# Patient Record
Sex: Female | Born: 1990 | Race: Black or African American | Hispanic: No | Smoking: Never smoker
Health system: Southern US, Community
[De-identification: ages and names within clinical notes are randomized; demographics above are authoritative.]

## PROBLEM LIST (undated history)

## (undated) DIAGNOSIS — N83209 Unspecified ovarian cyst, unspecified side: Secondary | ICD-10-CM

## (undated) HISTORY — PX: NO PAST SURGERIES: SHX2092

## (undated) HISTORY — DX: Unspecified ovarian cyst, unspecified side: N83.209

---

## 2018-01-15 ENCOUNTER — Encounter (HOSPITAL_COMMUNITY): Payer: Self-pay | Admitting: Emergency Medicine

## 2018-01-15 DIAGNOSIS — R55 Syncope and collapse: Secondary | ICD-10-CM | POA: Insufficient documentation

## 2018-01-15 LAB — CBC
HEMATOCRIT: 38.1 % (ref 36.0–46.0)
Hemoglobin: 12.5 g/dL (ref 12.0–15.0)
MCH: 28.7 pg (ref 26.0–34.0)
MCHC: 32.8 g/dL (ref 30.0–36.0)
MCV: 87.6 fL (ref 78.0–100.0)
Platelets: 255 10*3/uL (ref 150–400)
RBC: 4.35 MIL/uL (ref 3.87–5.11)
RDW: 13.4 % (ref 11.5–15.5)
WBC: 10.4 10*3/uL (ref 4.0–10.5)

## 2018-01-15 LAB — BASIC METABOLIC PANEL
Anion gap: 8 (ref 5–15)
BUN: 13 mg/dL (ref 6–20)
CALCIUM: 9.4 mg/dL (ref 8.9–10.3)
CO2: 25 mmol/L (ref 22–32)
Chloride: 105 mmol/L (ref 101–111)
Creatinine, Ser: 0.82 mg/dL (ref 0.44–1.00)
GFR calc Af Amer: >60 mL/min (ref >60)
GLUCOSE: 130 mg/dL — AB (ref 65–99)
POTASSIUM: 3.6 mmol/L (ref 3.5–5.1)
SODIUM: 138 mmol/L (ref 135–145)

## 2018-01-15 LAB — I-STAT BETA HCG BLOOD, ED (MC, WL, AP ONLY): I-stat hCG, quantitative: <5 m[IU]/mL (ref <5)

## 2018-01-15 NOTE — ED Triage Notes (Signed)
Patient c/o near syncopal episode while in class today. Reports fasting x2 days, only having water to drink during that time. Denies chest pain and SOB.

## 2018-01-16 ENCOUNTER — Emergency Department (HOSPITAL_COMMUNITY)
Admission: EM | Admit: 2018-01-16 | Discharge: 2018-01-16 | Disposition: A | Discharge status: Home / Self Care | Payer: Self-pay | Attending: Emergency Medicine | Admitting: Emergency Medicine

## 2018-01-16 DIAGNOSIS — R55 Syncope and collapse: Secondary | ICD-10-CM

## 2018-01-16 LAB — URINALYSIS, ROUTINE W REFLEX MICROSCOPIC
Bilirubin Urine: NEGATIVE
Glucose, UA: NEGATIVE mg/dL
HGB URINE DIPSTICK: NEGATIVE
Ketones, ur: 80 mg/dL — AB
LEUKOCYTES UA: NEGATIVE
Nitrite: NEGATIVE
Protein, ur: 30 mg/dL — AB
SPECIFIC GRAVITY, URINE: 1.027 (ref 1.005–1.030)
pH: 5 (ref 5.0–8.0)

## 2018-01-16 MED ORDER — ONDANSETRON 8 MG PO TBDP
8.0000 mg | ORAL_TABLET | Freq: Once | ORAL | Status: AC
  Administered 2018-01-16: 8 mg via ORAL
  Filled 2018-01-16: qty 1

## 2018-01-16 NOTE — ED Provider Notes (Signed)
[facility]-EMERGENCY DEPT Provider Note   CSN: [id number] Arrival date & time: 01/15/18  1943     History   Chief Complaint Chief Complaint  Patient presents with  . Near Syncope    HPI Brittany Key is a 27 y.o. female.  The history is provided by the patient.  Near Syncope  This is a new problem. The current episode started 3 to 5 hours ago. The problem occurs constantly. The problem has been resolved. Pertinent negatives include no chest pain, no abdominal pain, no headaches and no shortness of breath. Nothing aggravates the symptoms. Nothing relieves the symptoms. She has tried nothing for the symptoms. The treatment provided significant relief.  felt lightheaded as she has been doing a water fast for the past few days.  She is drinking only water and liquids but is not eating solids.    History reviewed. No pertinent past medical history.  There are no active problems to display for this patient.   History reviewed. No pertinent surgical history.  OB History    No data available       Home Medications    Prior to Admission medications   Not on File    Family History No family history on file.  Social History Social History   Tobacco Use  . Smoking status: Not on file  Substance Use Topics  . Alcohol use: Not on file  . Drug use: Not on file     Allergies   Patient has no known allergies.   Review of Systems Review of Systems  Constitutional: Negative for diaphoresis, fatigue and fever.  Eyes: Negative for photophobia and visual disturbance.  Respiratory: Negative for shortness of breath.   Cardiovascular: Positive for near-syncope. Negative for chest pain.  Gastrointestinal: Negative for abdominal pain.  Neurological: Negative for dizziness, tremors, seizures, syncope, facial asymmetry, speech difficulty, weakness, numbness and headaches.  All other systems reviewed and are negative.    Physical Exam Updated Vital  Signs BP 109/85 (BP Location: Left Arm)   Pulse (!) 52   Temp 98.4 F (36.9 C) (Oral)   Resp 14   LMP 12/31/2017   SpO2 100%   Physical Exam  Constitutional: She is oriented to person, place, and time. She appears well-developed and well-nourished. No distress.  HENT:  Head: Normocephalic and atraumatic.  Nose: Nose normal.  Mouth/Throat: No oropharyngeal exudate.  Eyes: Conjunctivae are normal. Pupils are equal, round, and reactive to light.  Neck: Normal range of motion. Neck supple.  Cardiovascular: Normal rate, regular rhythm, normal heart sounds and intact distal pulses.  Pulmonary/Chest: Effort normal and breath sounds normal. No stridor. She has no wheezes. She has no rales.  Abdominal: Soft. Bowel sounds are normal. She exhibits no mass. There is no tenderness. There is no rebound and no guarding.  Musculoskeletal: Normal range of motion.  Neurological: She is alert and oriented to person, place, and time. She displays normal reflexes.  Skin: Skin is warm and dry. Capillary refill takes less than 2 seconds.  Psychiatric: She has a normal mood and affect.     ED Treatments / Results  Labs (all labs ordered are listed, but only abnormal results are displayed)  Results for orders placed or performed during the hospital encounter of 01/16/18  Basic metabolic panel  Result Value Ref Range   Sodium 138 135 - 145 mmol/L   Potassium 3.6 3.5 - 5.1 mmol/L   Chloride 105 101 - 111 mmol/L   CO2 25  22 - 32 mmol/L   Glucose, Bld 130 (H) 65 - 99 mg/dL   BUN 13 6 - 20 mg/dL   Creatinine, Ser 4.090.82 0.44 - 1.00 mg/dL   Calcium 9.4 8.9 - 81.110.3 mg/dL   GFR calc non Af Amer >60 >60 mL/min   GFR calc Af Amer >60 >60 mL/min   Anion gap 8 5 - 15  CBC  Result Value Ref Range   WBC 10.4 4.0 - 10.5 K/uL   RBC 4.35 3.87 - 5.11 MIL/uL   Hemoglobin 12.5 12.0 - 15.0 g/dL   HCT 91.438.1 78.236.0 - 95.646.0 %   MCV 87.6 78.0 - 100.0 fL   MCH 28.7 26.0 - 34.0 pg   MCHC 32.8 30.0 - 36.0 g/dL   RDW 21.313.4  08.611.5 - 57.815.5 %   Platelets 255 150 - 400 K/uL  I-Stat beta hCG blood, ED  Result Value Ref Range   I-stat hCG, quantitative <5.0 <5 mIU/mL   Comment 3           No results found.  Orthostatic VS for the past 24 hrs:  BP- Lying Pulse- Lying BP- Sitting Pulse- Sitting BP- Standing at 0 minutes Pulse- Standing at 0 minutes  01/16/18 0246 109/85 52 120/80 54 110/84 72     Procedures Procedures (including critical care time)  Medications Ordered in ED Medications  ondansetron (ZOFRAN-ODT) disintegrating tablet 8 mg (8 mg Oral Given 01/16/18 0254)       No CP no SOB, Po challenged successfully in the ED.  No signs of dehydration.    Final Clinical Impressions(s) / ED Diagnoses   No signs of dehydration.  PERC negative wells 0 highly doubt PE in this low risk patient.  Her symptoms are clearly related to fasting and she has been instructed on eating a healthy diet and not fasting.     Return for weakness, numbness, changes in vision or speech, fevers >100.4 unrelieved by medication, shortness of breath, intractable vomiting, or diarrhea, abdominal pain, Inability to tolerate liquids or food, cough, altered mental status or any concerns. No signs of systemic illness or infection. The patient is nontoxic-appearing on exam and vital signs are within normal limits.   I have reviewed the triage vital signs and the nursing notes. Pertinent labs &imaging results that were available during my care of the patient were reviewed by me and considered in my medical decision making (see chart for details).  After history, exam, and medical workup I feel the patient has been appropriately medically screened and is safe for discharge home. Pertinent diagnoses were discussed with the patient. Patient was given return precautions.   Broady Lafoy, MD 01/16/18 [phone]

## 2019-01-01 ENCOUNTER — Encounter: Payer: Self-pay | Admitting: General Practice

## 2019-01-09 ENCOUNTER — Emergency Department (HOSPITAL_COMMUNITY)
Admission: EM | Admit: 2019-01-09 | Discharge: 2019-01-09 | Disposition: A | Discharge status: Home / Self Care | Payer: BLUE CROSS/BLUE SHIELD | Attending: Emergency Medicine | Admitting: Emergency Medicine

## 2019-01-09 ENCOUNTER — Other Ambulatory Visit: Payer: Self-pay

## 2019-01-09 ENCOUNTER — Encounter (HOSPITAL_COMMUNITY): Payer: Self-pay | Admitting: Emergency Medicine

## 2019-01-09 DIAGNOSIS — Z3491 Encounter for supervision of normal pregnancy, unspecified, first trimester: Secondary | ICD-10-CM

## 2019-01-09 DIAGNOSIS — R109 Unspecified abdominal pain: Secondary | ICD-10-CM | POA: Diagnosis present

## 2019-01-09 LAB — I-STAT BETA HCG BLOOD, ED (MC, WL, AP ONLY)

## 2019-01-09 LAB — COMPREHENSIVE METABOLIC PANEL
ALBUMIN: 4.2 g/dL (ref 3.5–5.0)
ALT: 12 U/L (ref 0–44)
ANION GAP: 8 (ref 5–15)
AST: 18 U/L (ref 15–41)
Alkaline Phosphatase: 59 U/L (ref 38–126)
BUN: 8 mg/dL (ref 6–20)
CHLORIDE: 105 mmol/L (ref 98–111)
CO2: 23 mmol/L (ref 22–32)
Calcium: 9.4 mg/dL (ref 8.9–10.3)
Creatinine, Ser: 0.6 mg/dL (ref 0.44–1.00)
GFR calc Af Amer: >60 mL/min (ref >60)
GLUCOSE: 82 mg/dL (ref 70–99)
POTASSIUM: 3.8 mmol/L (ref 3.5–5.1)
Sodium: 136 mmol/L (ref 135–145)
TOTAL PROTEIN: 7.5 g/dL (ref 6.5–8.1)
Total Bilirubin: 0.7 mg/dL (ref 0.3–1.2)

## 2019-01-09 LAB — CBC
HCT: 37.5 % (ref 36.0–46.0)
HEMOGLOBIN: 11.7 g/dL — AB (ref 12.0–15.0)
MCH: 27.7 pg (ref 26.0–34.0)
MCHC: 31.2 g/dL (ref 30.0–36.0)
MCV: 88.9 fL (ref 80.0–100.0)
Platelets: 306 10*3/uL (ref 150–400)
RBC: 4.22 MIL/uL (ref 3.87–5.11)
RDW: 13.8 % (ref 11.5–15.5)
WBC: 7.2 10*3/uL (ref 4.0–10.5)
nRBC: 0 % (ref 0.0–0.2)

## 2019-01-09 LAB — LIPASE, BLOOD: LIPASE: 22 U/L (ref 11–51)

## 2019-01-09 MED ORDER — PRENATAL COMPLETE 14-0.4 MG PO TABS: 1.0000 | ORAL_TABLET | Freq: Every day | ORAL | 0 refills | Status: AC

## 2019-01-09 MED ORDER — SODIUM CHLORIDE 0.9% FLUSH: 3.0000 mL | Freq: Once | INTRAVENOUS | Status: DC

## 2019-01-09 NOTE — Discharge Instructions (Addendum)
Take prenatal vitamins daily.   Take tylenol as needed for pain. Avoid ibuprofen   See your OB doctor  Return to ER if you have vaginal bleeding, discharge, fever, vomiting.

## 2019-01-09 NOTE — ED Provider Notes (Signed)
[facility]-EMERGENCY DEPT Provider Note   CSN: [id number] Arrival date & time: 01/09/19  1113    History   Chief Complaint Chief Complaint  Patient presents with  . Abdominal Pain    HPI Brittany Key is a 28 y.o. female here presenting with possible pregnancy.  Patient states that her last menstrual period was January 16.  She states that she had some lower abdominal cramps for the last week or so.  Denies any vaginal bleeding or discharge.  She felt that she may be [redacted] weeks pregnant but just wanted to be sure.  She denies any fevers or chills but felt nauseated.  She has not taken any prenatal vitamins and has appointment with [facility] doctor on March 30.     The history is provided by the patient.    History reviewed. No pertinent past medical history.  There are no active problems to display for this patient.   History reviewed. No pertinent surgical history.   OB History    Gravida  1   Para      Term      Preterm      AB      Living        SAB      TAB      Ectopic      Multiple      Live Births               Home Medications    Prior to Admission medications   Not on File    Family History No family history on file.  Social History Social History   Tobacco Use  . Smoking status: Not on file  Substance Use Topics  . Alcohol use: Not on file  . Drug use: Not on file     Allergies   Pineapple and Latex   Review of Systems Review of Systems  Gastrointestinal: Positive for abdominal pain.  All other systems reviewed and are negative.    Physical Exam Updated Vital Signs BP 110/83   Pulse 69   Temp 99 F (37.2 C)   Resp 18   Ht 5\' 4"  (1.626 m)   Wt 77.6 kg   LMP 11/21/2018   SpO2 100%   BMI 29.35 kg/m   Physical Exam Vitals signs and nursing note reviewed.  HENT:     Head: Normocephalic.     Mouth/Throat:     Mouth: Mucous membranes are moist.  Eyes:     Extraocular Movements: Extraocular  movements intact.  Cardiovascular:     Rate and Rhythm: Normal rate and regular rhythm.     Heart sounds: Normal heart sounds.  Pulmonary:     Effort: Pulmonary effort is normal.     Breath sounds: Normal breath sounds.  Abdominal:     General: Abdomen is flat. Bowel sounds are normal.     Palpations: Abdomen is soft.     Comments: ? Gravid uterus, nontender   Skin:    General: Skin is warm.     Capillary Refill: Capillary refill takes less than 2 seconds.  Neurological:     General: No focal deficit present.     Mental Status: She is alert.  Psychiatric:        Mood and Affect: Mood normal.        Behavior: Behavior normal.      ED Treatments / Results  Labs (all labs ordered are listed, but only abnormal results are displayed)  Labs Reviewed  CBC - Abnormal; Notable for the following components:      Result Value   Hemoglobin 11.7 (*)    All other components within normal limits  I-STAT BETA HCG BLOOD, ED ([facility], [facility], [facility] ONLY) - Abnormal; Notable for the following components:   I-stat hCG, quantitative >2,000.0 (*)    All other components within normal limits  LIPASE, BLOOD  COMPREHENSIVE METABOLIC PANEL  URINALYSIS, ROUTINE W REFLEX MICROSCOPIC    EKG None  Radiology No results found.  Procedures Procedures (including critical care time)  EMERGENCY DEPARTMENT [place] PREGNANCY "Study: Limited Ultrasound of the Pelvis for Pregnancy"  INDICATIONS:Pregnancy(required) Multiple views of the uterus and pelvic cavity were obtained in real-time with a multi-frequency probe.  APPROACH:Transabdominal  PERFORMED BY: Myself IMAGES ARCHIVED?: Yes LIMITATIONS: Body habitus PREGNANCY FREE FLUID: None ADNEXAL FINDINGS:Left ovary not seen and Right ovary not seen GESTATIONAL AGE, ESTIMATE: 6 week 4 days FETAL HEART RATE: 170 INTERPRETATION: Intrauterine gestational sac noted, Yolk sac noted, Fetal pole present and Fetal heart activity seen      Medications Ordered in  ED Medications  sodium chloride flush (NS) 0.9 % injection 3 mL (has no administration in time range)     Initial Impression / Assessment and Plan / ED Course  I have reviewed the triage vital signs and the nursing notes.  Pertinent labs & imaging results that were available during my care of the patient were reviewed by me and considered in my medical decision making (see chart for details).        Brittany Key is a 28 y.o. female here presenting with lower abdominal cramps.  She is about [redacted] weeks pregnant by dates.  She has no vaginal bleeding or vomiting or pain. HCG > 2000. Labs unremarkable. Bedside US confirmed live IUP with FHR of 170. No free fluid or adnexal mass. She has OB follow up. Told her to take prenatal vitamins and gave strict return precautions.    Final Clinical Impressions(s) / ED Diagnoses   Final diagnoses:  None    ED Discharge Orders    None       Charlynne Pander, MD 01/09/19 1302

## 2019-01-09 NOTE — ED Triage Notes (Signed)
Patient reports intermittent lower abdominal cramping x "a couple weeks." Reports she is seven weeks pregnant. States first OB appt is scheduled for 3/30. Denies vaginal bleeding.

## 2019-01-09 NOTE — ED Notes (Signed)
ED Provider at bedside. 

## 2019-01-27 ENCOUNTER — Ambulatory Visit (INDEPENDENT_AMBULATORY_CARE_PROVIDER_SITE_OTHER): Payer: BLUE CROSS/BLUE SHIELD | Admitting: *Deleted

## 2019-01-27 ENCOUNTER — Other Ambulatory Visit: Payer: Self-pay

## 2019-01-27 DIAGNOSIS — Z3400 Encounter for supervision of normal first pregnancy, unspecified trimester: Secondary | ICD-10-CM

## 2019-01-27 DIAGNOSIS — Z3401 Encounter for supervision of normal first pregnancy, first trimester: Secondary | ICD-10-CM

## 2019-01-27 NOTE — Patient Instructions (Addendum)
First Trimester of Pregnancy  The first trimester of pregnancy is from week 1 until the end of week 13 (months 1 through 3). During this time, your baby will begin to develop inside you. At 6-8 weeks, the eyes and face are formed, and the heartbeat can be seen on ultrasound. At the end of 12 weeks, all the baby's organs are formed. Prenatal care is all the medical care you receive before the birth of your baby. Make sure you get good prenatal care and follow all of your doctor's instructions. Follow these instructions at home: Medicines  Take over-the-counter and prescription medicines only as told by your doctor. Some medicines are safe and some medicines are not safe during pregnancy.  Take a prenatal vitamin that contains at least 600 micrograms (mcg) of folic acid.  If you have trouble pooping (constipation), take medicine that will make your stool soft (stool softener) if your doctor approves. Eating and drinking   Eat regular, healthy meals.  Your doctor will tell you the amount of weight gain that is right for you.  Avoid raw meat and uncooked cheese.  If you feel sick to your stomach (nauseous) or throw up (vomit): ? Eat 4 or 5 small meals a day instead of 3 large meals. ? Try eating a few soda crackers. ? Drink liquids between meals instead of during meals.  To prevent constipation: ? Eat foods that are high in fiber, like fresh fruits and vegetables, whole grains, and beans. ? Drink enough fluids to keep your pee (urine) clear or pale yellow. Activity  Exercise only as told by your doctor. Stop exercising if you have cramps or pain in your lower belly (abdomen) or low back.  Do not exercise if it is too hot, too humid, or if you are in a place of great height (high altitude).  Try to avoid standing for long periods of time. Move your legs often if you must stand in one place for a long time.  Avoid heavy lifting.  Wear low-heeled shoes. Sit and stand up  straight.  You can have sex unless your doctor tells you not to. Relieving pain and discomfort  Wear a good support bra if your breasts are sore.  Take warm water baths (sitz baths) to soothe pain or discomfort caused by hemorrhoids. Use hemorrhoid cream if your doctor says it is okay.  Rest with your legs raised if you have leg cramps or low back pain.  If you have puffy, bulging veins (varicose veins) in your legs: ? Wear support hose or compression stockings as told by your doctor. ? Raise (elevate) your feet for 15 minutes, 3-4 times a day. ? Limit salt in your food. Prenatal care  Schedule your prenatal visits by the twelfth week of pregnancy.  Write down your questions. Take them to your prenatal visits.  Keep all your prenatal visits as told by your doctor. This is important. Safety  Wear your seat belt at all times when driving.  Make a list of emergency phone numbers. The list should include numbers for family, friends, the hospital, and police and fire departments. General instructions  Ask your doctor for a referral to a local prenatal class. Begin classes no later than at the start of month 6 of your pregnancy.  Ask for help if you need counseling or if you need help with nutrition. Your doctor can give you advice or tell you where to go for help.  Do not use hot  tubs, steam rooms, or saunas.  Do not douche or use tampons or scented sanitary pads.  Do not cross your legs for long periods of time.  Avoid all herbs and alcohol. Avoid drugs that are not approved by your doctor.  Do not use any tobacco products, including cigarettes, chewing tobacco, and electronic cigarettes. If you need help quitting, ask your doctor. You may get counseling or other support to help you quit.  Avoid cat litter boxes and soil used by cats. These carry germs that can cause birth defects in the baby and can cause a loss of your baby (miscarriage) or stillbirth.  Visit your dentist.  At home, brush your teeth with a soft toothbrush. Be gentle when you floss. Contact a doctor if:  You are dizzy.  You have mild cramps or pressure in your lower belly.  You have a nagging pain in your belly area.  You continue to feel sick to your stomach, you throw up, or you have watery poop (diarrhea).  You have a bad smelling fluid coming from your vagina.  You have pain when you pee (urinate).  You have increased puffiness (swelling) in your face, hands, legs, or ankles. Get help right away if:  You have a fever.  You are leaking fluid from your vagina.  You have spotting or bleeding from your vagina.  You have very bad belly cramping or pain.  You gain or lose weight rapidly.  You throw up blood. It may look like coffee grounds.  You are around people who have [place] measles, fifth disease, or chickenpox.  You have a very bad headache.  You have shortness of breath.  You have any kind of trauma, such as from a fall or a car accident. Summary  The first trimester of pregnancy is from week 1 until the end of week 13 (months 1 through 3).  To take care of yourself and your unborn baby, you will need to eat healthy meals, take medicines only if your doctor tells you to do so, and do activities that are safe for you and your baby.  Keep all follow-up visits as told by your doctor. This is important as your doctor will have to ensure that your baby is healthy and growing well. This information is not intended to replace advice given to you by your health care provider. Make sure you discuss any questions you have with your health care provider. Document Released: 04/10/2008 Document Revised: 10/31/2016 Document Reviewed: 10/31/2016 Elsevier Interactive Patient Education  2019 [employer].  Breastfeeding  Choosing to breastfeed is one of the best decisions you can make for yourself and your baby. A change in hormones during pregnancy causes your breasts to make  breast milk in your milk-producing glands. Hormones prevent breast milk from being released before your baby is born. They also prompt milk flow after birth. Once breastfeeding has begun, thoughts of your baby, as well as his or her sucking or crying, can stimulate the release of milk from your milk-producing glands. Benefits of breastfeeding Research shows that breastfeeding offers many health benefits for infants and mothers. It also offers a cost-free and convenient way to feed your baby. For your baby  Your first milk (colostrum) helps your baby's digestive system to function better.  Special cells in your milk (antibodies) help your baby to fight off infections.  Breastfed babies are less likely to develop asthma, allergies, obesity, or type 2 diabetes. They are also at lower risk for sudden infant  death syndrome (SIDS).  Nutrients in breast milk are better able to meet your babys needs compared to infant formula.  Breast milk improves your baby's brain development. For you  Breastfeeding helps to create a very special bond between you and your baby.  Breastfeeding is convenient. Breast milk costs nothing and is always available at the correct temperature.  Breastfeeding helps to burn calories. It helps you to lose the weight that you gained during pregnancy.  Breastfeeding makes your uterus return faster to its size before pregnancy. It also slows bleeding (lochia) after you give birth.  Breastfeeding helps to lower your risk of developing type 2 diabetes, osteoporosis, rheumatoid arthritis, cardiovascular disease, and breast, ovarian, uterine, and endometrial cancer later in life. Breastfeeding basics Starting breastfeeding  Find a comfortable place to sit or lie down, with your neck and back well-supported.  Place a pillow or a rolled-up blanket under your baby to bring him or her to the level of your breast (if you are seated). Nursing pillows are specially designed to help  support your arms and your baby while you breastfeed.  Make sure that your baby's tummy (abdomen) is facing your abdomen.  Gently massage your breast. With your fingertips, massage from the outer edges of your breast inward toward the nipple. This encourages milk flow. If your milk flows slowly, you may need to continue this action during the feeding.  Support your breast with 4 fingers underneath and your thumb above your nipple (make the letter "C" with your hand). Make sure your fingers are well away from your nipple and your babys mouth.  Stroke your baby's lips gently with your finger or nipple.  When your baby's mouth is open wide enough, quickly bring your baby to your breast, placing your entire nipple and as much of the areola as possible into your baby's mouth. The areola is the colored area around your nipple. ? More areola should be visible above your baby's upper lip than below the lower lip. ? Your baby's lips should be opened and extended outward (flanged) to ensure an adequate, comfortable latch. ? Your baby's tongue should be between his or her lower gum and your breast.  Make sure that your baby's mouth is correctly positioned around your nipple (latched). Your baby's lips should create a seal on your breast and be turned out (everted).  It is common for your baby to suck about 2-3 minutes in order to start the flow of breast milk. Latching Teaching your baby how to latch onto your breast properly is very important. An improper latch can cause nipple pain, decreased milk supply, and poor weight gain in your baby. Also, if your baby is not latched onto your nipple properly, he or she may swallow some air during feeding. This can make your baby fussy. Burping your baby when you switch breasts during the feeding can help to get rid of the air. However, teaching your baby to latch on properly is still the best way to prevent fussiness from swallowing air while breastfeeding. Signs  that your baby has successfully latched onto your nipple  Silent tugging or silent sucking, without causing you pain. Infant's lips should be extended outward (flanged).  Swallowing heard between every 3-4 sucks once your milk has started to flow (after your let-down milk reflex occurs).  Muscle movement above and in front of his or her ears while sucking. Signs that your baby has not successfully latched onto your nipple  Sucking sounds or smacking sounds  from your baby while breastfeeding.  Nipple pain. If you think your baby has not latched on correctly, slip your finger into the corner of your babys mouth to break the suction and place it between your baby's gums. Attempt to start breastfeeding again. Signs of successful breastfeeding Signs from your baby  Your baby will gradually decrease the number of sucks or will completely stop sucking.  Your baby will fall asleep.  Your baby's body will relax.  Your baby will retain a small amount of milk in his or her mouth.  Your baby will let go of your breast by himself or herself. Signs from you  Breasts that have increased in firmness, weight, and size 1-3 hours after feeding.  Breasts that are softer immediately after breastfeeding.  Increased milk volume, as well as a change in milk consistency and color by the fifth day of breastfeeding.  Nipples that are not sore, cracked, or bleeding. Signs that your baby is getting enough milk  Wetting at least 1-2 diapers during the first 24 hours after birth.  Wetting at least 5-6 diapers every 24 hours for the first week after birth. The urine should be clear or pale yellow by the age of 5 days.  Wetting 6-8 diapers every 24 hours as your baby continues to grow and develop.  At least 3 stools in a 24-hour period by the age of 5 days. The stool should be soft and yellow.  At least 3 stools in a 24-hour period by the age of 7 days. The stool should be seedy and yellow.  No loss of  weight greater than 10% of birth weight during the first 3 days of life.  Average weight gain of 4-7 oz (113-198 g) per week after the age of 4 days.  Consistent daily weight gain by the age of 5 days, without weight loss after the age of 2 weeks. After a feeding, your baby may spit up a small amount of milk. This is normal. Breastfeeding frequency and duration Frequent feeding will help you make more milk and can prevent sore nipples and extremely full breasts (breast engorgement). Breastfeed when you feel the need to reduce the fullness of your breasts or when your baby shows signs of hunger. This is called "breastfeeding on demand." Signs that your baby is hungry include:  Increased alertness, activity, or restlessness.  Movement of the head from side to side.  Opening of the mouth when the corner of the mouth or cheek is stroked (rooting).  Increased sucking sounds, smacking lips, cooing, sighing, or squeaking.  Hand-to-mouth movements and sucking on fingers or hands.  Fussing or crying. Avoid introducing a pacifier to your baby in the first 4-6 weeks after your baby is born. After this time, you may choose to use a pacifier. Research has shown that pacifier use during the first year of a baby's life decreases the risk of sudden infant death syndrome (SIDS). Allow your baby to feed on each breast as long as he or she wants. When your baby unlatches or falls asleep while feeding from the first breast, offer the second breast. Because newborns are often sleepy in the first few weeks of life, you may need to awaken your baby to get him or her to feed. Breastfeeding times will vary from baby to baby. However, the following rules can serve as a guide to help you make sure that your baby is properly fed:  Newborns (babies 67 weeks of age or younger) may breastfeed  every 1-3 hours.  Newborns should not go without breastfeeding for longer than 3 hours during the day or 5 hours during the  night.  You should breastfeed your baby a minimum of 8 times in a 24-hour period. Breast milk pumping     Pumping and storing breast milk allows you to make sure that your baby is exclusively fed your breast milk, even at times when you are unable to breastfeed. This is especially important if you go back to work while you are still breastfeeding, or if you are not able to be present during feedings. Your lactation consultant can help you find a method of pumping that works best for you and give you guidelines about how long it is safe to store breast milk. Caring for your breasts while you breastfeed Nipples can become dry, cracked, and sore while breastfeeding. The following recommendations can help keep your breasts moisturized and healthy:  Avoid using soap on your nipples.  Wear a supportive bra designed especially for nursing. Avoid wearing underwire-style bras or extremely tight bras (sports bras).  Air-dry your nipples for 3-4 minutes after each feeding.  Use only cotton bra pads to absorb leaked breast milk. Leaking of breast milk between feedings is normal.  Use lanolin on your nipples after breastfeeding. Lanolin helps to maintain your skin's normal moisture barrier. Pure lanolin is not harmful (not toxic) to your baby. You may also hand express a few drops of breast milk and gently massage that milk into your nipples and allow the milk to air-dry. In the first few weeks after giving birth, some women experience breast engorgement. Engorgement can make your breasts feel heavy, warm, and tender to the touch. Engorgement peaks within 3-5 days after you give birth. The following recommendations can help to ease engorgement:  Completely empty your breasts while breastfeeding or pumping. You may want to start by applying warm, moist heat (in the shower or with warm, water-soaked hand towels) just before feeding or pumping. This increases circulation and helps the milk flow. If your baby  does not completely empty your breasts while breastfeeding, pump any extra milk after he or she is finished.  Apply ice packs to your breasts immediately after breastfeeding or pumping, unless this is too uncomfortable for you. To do this: ? Put ice in a plastic bag. ? Place a towel between your skin and the bag. ? Leave the ice on for 20 minutes, 2-3 times a day.  Make sure that your baby is latched on and positioned properly while breastfeeding. If engorgement persists after 48 hours of following these recommendations, contact your health care provider or a Science writer. Overall health care recommendations while breastfeeding  Eat 3 healthy meals and 3 snacks every day. Well-nourished mothers who are breastfeeding need an additional 450-500 calories a day. You can meet this requirement by increasing the amount of a balanced diet that you eat.  Drink enough water to keep your urine pale yellow or clear.  Rest often, relax, and continue to take your prenatal vitamins to prevent fatigue, stress, and low vitamin and mineral levels in your body (nutrient deficiencies).  Do not use any products that contain nicotine or tobacco, such as cigarettes and e-cigarettes. Your baby may be harmed by chemicals from cigarettes that pass into breast milk and exposure to secondhand smoke. If you need help quitting, ask your health care provider.  Avoid alcohol.  Do not use illegal drugs or marijuana.  Talk with your health care provider  before taking any medicines. These include over-the-counter and prescription medicines as well as vitamins and herbal supplements. Some medicines that may be harmful to your baby can pass through breast milk.  It is possible to become pregnant while breastfeeding. If birth control is desired, ask your health care provider about options that will be safe while breastfeeding your baby. Where to find more information: [employer]:  www.llli.org Contact a health care provider if:  You feel like you want to stop breastfeeding or have become frustrated with breastfeeding.  Your nipples are cracked or bleeding.  Your breasts are red, tender, or warm.  You have: ? Painful breasts or nipples. ? A swollen area on either breast. ? A fever or chills. ? Nausea or vomiting. ? Drainage other than breast milk from your nipples.  Your breasts do not become full before feedings by the fifth day after you give birth.  You feel sad and depressed.  Your baby is: ? Too sleepy to eat well. ? Having trouble sleeping. ? More than 22 week old and wetting fewer than 6 diapers in a 24-hour period. ? Not gaining weight by 47 days of age.  Your baby has fewer than 3 stools in a 24-hour period.  Your baby's skin or the white parts of his or her eyes become yellow. Get help right away if:  Your baby is overly tired (lethargic) and does not want to wake up and feed.  Your baby develops an unexplained fever. Summary  Breastfeeding offers many health benefits for infant and mothers.  Try to breastfeed your infant when he or she shows early signs of hunger.  Gently tickle or stroke your baby's lips with your finger or nipple to allow the baby to open his or her mouth. Bring the baby to your breast. Make sure that much of the areola is in your baby's mouth. Offer one side and burp the baby before you offer the other side.  Talk with your health care provider or lactation consultant if you have questions or you face problems as you breastfeed. This information is not intended to replace advice given to you by your health care provider. Make sure you discuss any questions you have with your health care provider. Document Released: 10/23/2005 Document Revised: 11/24/2016 Document Reviewed: 11/24/2016 Elsevier Interactive Patient Education  2019 [place].  Exclusive Breastfeeding  Exclusive breastfeeding means feeding a baby with  breast milk only. It is recommended that babies be exclusively breastfed for 6 months. Breastfeeding may continue until a baby is 1 year or older, if wanted by both mother and child. Exclusive breastfeeding for at least 6 months has many benefits for both the mother and the baby. What are the benefits of exclusive breastfeeding?  Exclusive breastfeeding helps your baby grow and develop normally. One reason for this is that breast milk has all the nutrients that a baby needs, when the baby needs them.  Breast milk helps develop your baby's immune system by providing proteins called antibodies that help fight off germs.  Exclusive breastfeeding may lower your baby's risk for: ? Stomach and intestinal problems. ? Allergies. ? Ear infections. ? Respiratory infections. ? Obesity. ? Diabetes. ? Sudden infant death syndrome (SIDS).  Breastfeeding helps improve your recovery from giving birth by: ? Reducing how much blood you lose after delivery. ? Speeding up how quickly your uterus heals. ? Reducing your risk of postpartum depression. ? Increasing the time before your routine menstrual periods return (lactational amenorrhea),  which can help to delay pregnancy if you are not using birth control. What are some tips for exclusive breastfeeding?  Start breastfeeding within your baby's first hour of life.  Do not give your baby infant formula, water, or solid food before your baby is 81 months old, unless told by your health care provider.  Feed your baby on-demand. This means feeding anytime your child expresses signs of hunger. This can help maintain your milk supply. Signs of hunger include: ? Moving restlessly. ? Rooting. This is when the baby looks like he or she is sucking without anything in the mouth. ? Bringing hands to the mouth. ? Crying.  Avoid using bottles in the first several weeks.  Do not use pacifiers.  If you must bottle feed: ? Continue to offer your baby breast milk by  using a breast pump to maintain your milk supply. ? Pump after feedings and store extra breast milk. ? Offer only breast milk in a bottle. What happens if I start supplementing feedings? If you work outside the home, it may be difficult to continue exclusive breastfeeding. However, you can make sure your baby continues to receive only breast milk by pumping and providing breast milk through bottle feeding. Sometimes it is necessary to supplement feedings. If your baby was born prematurely or has vitamin or mineral deficiencies, your health care provider may recommend giving your baby rehydration liquids or vitamin and mineral supplements with breast milk. If you start supplementing feedings, your baby will drink less breast milk and your body will respond by making less breast milk. If you choose to supplement feedings but would like to maintain your milk supply so you can breastfeed your baby exclusively later on, you can pump your breast milk and give your baby your breast milk by bottle. Where to find support  Health care providers and lactation specialists. They can help by: ? Giving you educational materials. ? Giving you information about where you can get supplies such as breast pumps and nursing bras. ? Providing you with counseling if you need emotional support. ? Sharing feeding basics with you, such as effective positions for breastfeeding. ? Troubleshooting feeding challenges.  Your peers. Your friends, family, and other women can help by: ? Sharing their experiences and success stories. ? Giving you new ideas. ? Encouraging you to keep breastfeeding even when it feels difficult.  Educational programs about breastfeeding. These programs can help you prepare for breastfeeding before your baby is born. Educational programs include: ? Classes. ? Print handouts. ? Videos. ? Telephone support. ? One-on-one instruction. Summary  Exclusive breastfeeding means feeding a baby with  breast milk only.  Exclusive breastfeeding provides many benefits for both you and your baby.  Exclusive breastfeeding for the first 6 months of your baby's life is recommended.  You can find support for breastfeeding through your health care provider, friends and family, and educational programs. This information is not intended to replace advice given to you by your health care provider. Make sure you discuss any questions you have with your health care provider. Document Released: 08/19/2009 Document Revised: 09/11/2016 Document Reviewed: 09/11/2016 Elsevier Interactive Patient Education  2019 [place] Following a healthy eating pattern may help you to achieve and maintain a healthy body weight, reduce the risk of chronic disease, and live a long and productive life. It is important to follow a healthy eating pattern at an appropriate calorie level for your body. Your nutritional needs should be met primarily  through food by choosing a variety of nutrient-rich foods. What are tips for following this plan? Reading food labels  Read labels and choose the following: ? Reduced or low sodium. ? Juices with 100% fruit juice. ? Foods with low saturated fats and high polyunsaturated and monounsaturated fats. ? Foods with whole grains, such as whole wheat, cracked wheat, brown rice, and wild rice. ? Whole grains that are fortified with folic acid. This is recommended for women who are pregnant or who want to become pregnant.  Read labels and avoid the following: ? Foods with a lot of added sugars. These include foods that contain brown sugar, corn sweetener, corn syrup, dextrose, fructose, glucose, high-fructose corn syrup, honey, invert sugar, lactose, malt syrup, maltose, molasses, raw sugar, sucrose, trehalose, or turbinado sugar.  Do not eat more than the following amounts of added sugar per day:  6 teaspoons (25 g) for women.  9 teaspoons (38 g) for men. ? Foods  that contain processed or refined starches and grains. ? Refined grain products, such as white flour, degermed cornmeal, white bread, and white rice. Shopping  Choose nutrient-rich snacks, such as vegetables, whole fruits, and nuts. Avoid high-calorie and high-sugar snacks, such as potato chips, fruit snacks, and candy.  Use oil-based dressings and spreads on foods instead of solid fats such as butter, stick margarine, or cream cheese.  Limit pre-made sauces, mixes, and "instant" products such as flavored rice, instant noodles, and ready-made pasta.  Try more plant-protein sources, such as tofu, tempeh, black beans, edamame, lentils, nuts, and seeds.  Explore eating plans such as the Mediterranean diet or vegetarian diet. Cooking  Use oil to saut or stir-fry foods instead of solid fats such as butter, stick margarine, or lard.  Try baking, boiling, grilling, or broiling instead of frying.  Remove the fatty part of meats before cooking.  Steam vegetables in water or broth. Meal planning   At meals, imagine dividing your plate into fourths: ? One-half of your plate is fruits and vegetables. ? One-fourth of your plate is whole grains. ? One-fourth of your plate is protein, especially lean meats, poultry, eggs, tofu, beans, or nuts.  Include low-fat dairy as part of your daily diet. Lifestyle  Choose healthy options in all settings, including home, work, school, restaurants, or stores.  Prepare your food safely: ? Wash your hands after handling raw meats. ? Keep food preparation surfaces clean by regularly washing with hot, soapy water. ? Keep raw meats separate from ready-to-eat foods, such as fruits and vegetables. ? Cook seafood, meat, poultry, and eggs to the recommended internal temperature. ? Store foods at safe temperatures. In general:  Keep cold foods at 35F (4.4C) or below.  Keep hot foods at 135F (60C) or above.  Keep your freezer at [facility] (-17.8C) or  below.  Foods are no longer safe to eat when they have been between the temperatures of 40-135F (4.4-60C) for more than 2 hours. What foods should I eat? Fruits Aim to eat 2 cup-equivalents of fresh, canned (in natural juice), or frozen fruits each day. Examples of 1 cup-equivalent of fruit include 1 small apple, 8 large strawberries, 1 cup canned fruit,  cup dried fruit, or 1 cup 100% juice. Vegetables Aim to eat 2-3 cup-equivalents of fresh and frozen vegetables each day, including different varieties and colors. Examples of 1 cup-equivalent of vegetables include 2 medium carrots, 2 cups raw, leafy greens, 1 cup chopped vegetable (raw or cooked), or 1 medium baked potato. Grains Aim  to eat 6 ounce-equivalents of whole grains each day. Examples of 1 ounce-equivalent of grains include 1 slice of bread, 1 cup ready-to-eat cereal, 3 cups popcorn, or  cup cooked rice, pasta, or cereal. Meats and other proteins Aim to eat 5-6 ounce-equivalents of protein each day. Examples of 1 ounce-equivalent of protein include 1 egg, 1/2 cup nuts or seeds, or 1 tablespoon (16 g) peanut butter. A cut of meat or fish that is the size of a deck of cards is about 3-4 ounce-equivalents.  Of the protein you eat each week, try to have at least 8 ounces come from seafood. This includes salmon, trout, herring, and anchovies. Dairy Aim to eat 3 cup-equivalents of fat-free or low-fat dairy each day. Examples of 1 cup-equivalent of dairy include 1 cup (240 mL) milk, 8 ounces (250 g) yogurt, 1 ounces (44 g) natural cheese, or 1 cup (240 mL) fortified soy milk. Fats and oils  Aim for about 5 teaspoons (21 g) per day. Choose monounsaturated fats, such as canola and olive oils, avocados, peanut butter, and most nuts, or polyunsaturated fats, such as sunflower, corn, and soybean oils, walnuts, pine nuts, sesame seeds, sunflower seeds, and flaxseed. Beverages  Aim for six 8-oz glasses of water per day. Limit coffee to three  to five 8-oz cups per day.  Limit caffeinated beverages that have added calories, such as soda and energy drinks.  Limit alcohol intake to no more than 1 drink a day for nonpregnant women and 2 drinks a day for men. One drink equals 12 oz of beer (355 mL), 5 oz of wine (148 mL), or 1 oz of hard liquor (44 mL). Seasoning and other foods  Avoid adding excess amounts of salt to your foods. Try flavoring foods with herbs and spices instead of salt.  Avoid adding sugar to foods.  Try using oil-based dressings, sauces, and spreads instead of solid fats. This information is based on general [place] nutrition guidelines. For more information, visit [employer]. Exact amounts may vary based on your nutrition needs. Summary  A healthy eating plan may help you to maintain a healthy weight, reduce the risk of chronic diseases, and stay active throughout your life.  Plan your meals. Make sure you eat the right portions of a variety of nutrient-rich foods.  Try baking, boiling, grilling, or broiling instead of frying.  Choose healthy options in all settings, including home, work, school, restaurants, or stores. This information is not intended to replace advice given to you by your health care provider. Make sure you discuss any questions you have with your health care provider. Document Released: 02/04/2018 Document Revised: 02/04/2018 Document Reviewed: 02/04/2018 Elsevier Interactive Patient Education  2019 [employer].  Warning Signs During Pregnancy A pregnancy lasts about 40 weeks, starting from the first day of your last period until the baby is born. Pregnancy is divided into three phases called trimesters.  The first trimester refers to week 1 through week 13 of pregnancy.  The second trimester is the start of week 14 through the end of week 27.  The third trimester is the start of week 28 until you deliver your baby. During each trimester of pregnancy, certain signs and symptoms  may indicate a problem. Talk with your health care provider about your current health and any medical conditions you have. Make sure you know the symptoms that you should watch for and report. How does this affect me?  Warning signs in the first trimester While some changes during the  first trimester may be uncomfortable, most do not represent a serious problem. Let your health care provider know if you have any of the following warning signs in the first trimester:  You cannot eat or drink without vomiting, and this lasts for longer than a day.  You have vaginal bleeding or spotting along with menstrual-like cramping.  You have diarrhea for longer than a day.  You have a fever or other signs of infection, such as: ? Pain or burning when you urinate. ? Foul smelling or thick or yellowish vaginal discharge. Warning signs in the second trimester As your baby grows and changes during the second trimester, there are additional signs and symptoms that may indicate a problem. These include:  Signs and symptoms of infection, including a fever.  Signs or symptoms of a miscarriage or preterm labor, such as regular contractions, menstrual-like cramping, or lower abdominal pain.  Bloody or watery vaginal discharge or obvious vaginal bleeding.  Feeling like your heart is pounding.  Having trouble breathing.  Nausea, vomiting, or diarrhea that lasts for longer than a day.  Craving non-food items, such as clay, chalk, or dirt. This may be a sign of a very treatable medical condition called pica. Later in your second trimester, watch for signs and symptoms of a serious medical condition called preeclampsia.These include:  Changes in your vision.  A severe headache that does not go away.  Nausea and vomiting. It is also important to notice if your baby stops moving or moves less than usual during this time. Warning signs in the third trimester As you approach the third trimester, your baby is  growing and your body is preparing for the birth of your baby. In your third trimester, be sure to let your health care provider know if:  You have signs and symptoms of infection, including a fever.  You have vaginal bleeding.  You notice that your baby is moving less than usual or is not moving.  You have nausea, vomiting, or diarrhea that lasts for longer than a day.  You have a severe headache that does not go away.  You have vision changes, including seeing spots or having blurry or double vision.  You have increased swelling in your hands or face. How does this affect my baby? Throughout your pregnancy, always report any of the warning signs of a problem to your health care provider. This can help prevent complications that may affect your baby, including:  Increased risk for premature birth.  Infection that may be transmitted to your baby.  Increased risk for stillbirth. Contact a health care provider if:  You have any of the warning signs of a problem for the current trimester of your pregnancy.  Any of the following apply to you during any trimester of pregnancy: ? You have strong emotions, such as sadness or anxiety, that interfere with work or personal relationships. ? You feel unsafe in your home and need help finding a safe place to live. ? You are using tobacco products, alcohol, or drugs and you need help to stop. Get help right away if: You have signs or symptoms of labor before 37 weeks of pregnancy. These include:  Contractions that are 5 minutes or less apart, or that increase in frequency, intensity, or length.  Sudden, sharp abdominal pain or low back pain.  Uncontrolled gush or trickle of fluid from your vagina. Summary  A pregnancy lasts about 40 weeks, starting from the first day of your last period until the  baby is born. Pregnancy is divided into three phases called trimesters. Each trimester has warning signs to watch for.  Always report any  warning signs to your health care provider in order to prevent complications that may affect both you and your baby.  Talk with your health care provider about your current health and any medical conditions you have. Make sure you know the symptoms that you should watch for and report. This information is not intended to replace advice given to you by your health care provider. Make sure you discuss any questions you have with your health care provider. Document Released: 08/09/2017 Document Revised: 08/09/2017 Document Reviewed: 08/09/2017 Elsevier Interactive Patient Education  2019 [employer].

## 2019-01-27 NOTE — Progress Notes (Addendum)
   PRENATAL INTAKE SUMMARY  Ms. Pursley presents today New OB Nurse Interview.  OB History    Gravida  1   Para      Term      Preterm      AB      Living        SAB      TAB      Ectopic      Multiple      Live Births             I have reviewed the patient's medical, obstetrical, social, and family histories, medications, and available lab results.  SUBJECTIVE She has no unusual complaints.  OBJECTIVE Initial nurse interview for history and lab work (New OB).  EDD: 08/28/2019 by [facility] GA: [redacted]w[redacted]d G1P0 FHT: 166  Today's Vitals   01/27/19 0854  BP: 122/74  Pulse: 83  Temp: 98.2 F (36.8 C)  Weight: 174 lb 6.4 oz (79.1 kg)  Height: 5\' 4"  (1.626 m)    GENERAL APPEARANCE: alert, well appearing, in no apparent distress, oriented to person, place and time, well hydrated.   ASSESSMENT Normal pregnancy.  PLAN Prenatal care-[facility] Pnl/HIV  OB Urine Culture GC/CT/PAP at next visit with Midwife HgbEval SMA/CF ([facility]) at next visit Panorama at next visit A1C Glucose  Flu declined Continue PNV  Clovis Pu, RN

## 2019-01-28 LAB — GLUCOSE, RANDOM: GLUCOSE: 81 mg/dL (ref 65–99)

## 2019-01-29 LAB — HEMOGLOBINOPATHY EVALUATION
FERRITIN: 25 ng/mL (ref 15–150)
HGB F QUANT: 0 % (ref 0.0–2.0)
HGB SOLUBILITY: NEGATIVE
Hgb A2 Quant: 2.2 % (ref 1.8–3.2)
Hgb A: 97.8 % (ref 96.4–98.8)
Hgb C: 0 %
Hgb S: 0 %
Hgb Variant: 0 %

## 2019-01-29 LAB — OBSTETRIC PANEL, INCLUDING HIV
Antibody Screen: NEGATIVE
BASOS ABS: 0 10*3/uL (ref 0.0–0.2)
Basos: 0 %
EOS (ABSOLUTE): 0.1 10*3/uL (ref 0.0–0.4)
Eos: 1 %
HEP B S AG: NEGATIVE
HIV SCREEN 4TH GENERATION: NONREACTIVE
Hematocrit: 31.4 % — ABNORMAL LOW (ref 34.0–46.6)
Hemoglobin: 10.7 g/dL — ABNORMAL LOW (ref 11.1–15.9)
IMMATURE GRANULOCYTES: 1 %
Immature Grans (Abs): 0 10*3/uL (ref 0.0–0.1)
LYMPHS ABS: 1.8 10*3/uL (ref 0.7–3.1)
Lymphs: 22 %
MCH: 28.3 pg (ref 26.6–33.0)
MCHC: 34.1 g/dL (ref 31.5–35.7)
MCV: 83 fL (ref 79–97)
MONOCYTES: 6 %
Monocytes Absolute: 0.5 10*3/uL (ref 0.1–0.9)
NEUTROS PCT: 70 %
Neutrophils Absolute: 6 10*3/uL (ref 1.4–7.0)
PLATELETS: 304 10*3/uL (ref 150–450)
RBC: 3.78 x10E6/uL (ref 3.77–5.28)
RDW: 13.8 % (ref 11.7–15.4)
RPR: NONREACTIVE
RUBELLA: 8.9 {index} (ref >0.99)
Rh Factor: POSITIVE
WBC: 8.4 10*3/uL (ref 3.4–10.8)

## 2019-01-29 LAB — HEMOGLOBIN A1C
ESTIMATED AVERAGE GLUCOSE: 105 mg/dL
Hgb A1c MFr Bld: 5.3 % (ref 4.8–5.6)

## 2019-01-29 LAB — URINE CULTURE, OB REFLEX

## 2019-01-29 LAB — CULTURE, OB URINE

## 2019-01-30 ENCOUNTER — Ambulatory Visit (HOSPITAL_COMMUNITY)
Admission: RE | Admit: 2019-01-30 | Discharge: 2019-01-30 | Disposition: A | Discharge status: Home / Self Care | Payer: BLUE CROSS/BLUE SHIELD | Source: Ambulatory Visit | Attending: Obstetrics and Gynecology | Admitting: Obstetrics and Gynecology

## 2019-01-30 ENCOUNTER — Ambulatory Visit (HOSPITAL_COMMUNITY): Payer: BLUE CROSS/BLUE SHIELD

## 2019-01-30 ENCOUNTER — Other Ambulatory Visit: Payer: Self-pay

## 2019-01-30 ENCOUNTER — Other Ambulatory Visit: Payer: Self-pay | Admitting: Obstetrics and Gynecology

## 2019-01-30 DIAGNOSIS — Z3400 Encounter for supervision of normal first pregnancy, unspecified trimester: Secondary | ICD-10-CM | POA: Diagnosis not present

## 2019-02-10 ENCOUNTER — Ambulatory Visit: Payer: Self-pay | Admitting: Family Medicine

## 2019-02-19 ENCOUNTER — Ambulatory Visit (INDEPENDENT_AMBULATORY_CARE_PROVIDER_SITE_OTHER): Payer: BLUE CROSS/BLUE SHIELD | Admitting: Obstetrics and Gynecology

## 2019-02-19 ENCOUNTER — Encounter: Payer: Self-pay | Admitting: Obstetrics and Gynecology

## 2019-02-19 ENCOUNTER — Encounter: Payer: Self-pay | Admitting: General Practice

## 2019-02-19 ENCOUNTER — Other Ambulatory Visit: Payer: Self-pay

## 2019-02-19 DIAGNOSIS — Z3401 Encounter for supervision of normal first pregnancy, first trimester: Secondary | ICD-10-CM | POA: Diagnosis not present

## 2019-02-19 DIAGNOSIS — Z3A12 12 weeks gestation of pregnancy: Secondary | ICD-10-CM | POA: Diagnosis not present

## 2019-02-19 DIAGNOSIS — Z3400 Encounter for supervision of normal first pregnancy, unspecified trimester: Secondary | ICD-10-CM

## 2019-02-19 NOTE — Progress Notes (Signed)
Pap completed in the last three years at [facility]`

## 2019-02-19 NOTE — Progress Notes (Signed)
Decline flu today  Interested in genetic testing

## 2019-02-19 NOTE — Patient Instructions (Signed)
Considering Waterbirth? Guide for patients at [facility]  Why consider waterbirth?  . Gentle birth for babies . Less pain medicine used in labor . May allow for passive descent/less pushing . May reduce perineal tears  . More mobility and instinctive maternal position changes . Increased maternal relaxation . Reduced blood pressure in labor  Is waterbirth safe? What are the risks of infection, drowning or other complications?  . Infection: o Very low risk (3.7 % for tub vs 4.8% for bed) o 7 in 8000 waterbirths with documented infection o Poorly cleaned equipment most common cause o Slightly lower group B strep transmission rate  . Drowning o Maternal:  - Very low risk   - Related to seizures or fainting o Newborn:  - Very low risk. No evidence of increased risk of respiratory problems in multiple large studies - Physiological protection from breathing under water - Avoid underwater birth if there are any fetal complications - Once baby's head is out of the water, keep it out.  . Birth complication o Some reports of cord trauma, but risk decreased by bringing baby to surface gradually o No evidence of increased risk of shoulder dystocia. Mothers can usually change positions faster in water than in a bed, possibly aiding the maneuvers to free the shoulder.   You must attend a Waterbirth class at [facility]  3rd Wednesday of every month from 7-9pm  Free  Register by calling [phone] or online at [email or web address]  Bring us the certificate from the class to your prenatal appointment  Meet with a midwife at 36 weeks to see if you can still plan a waterbirth and to sign the consent.   If you plan a waterbirth after December 29, 2018, at [facility] [place], you will need to purchase the following:  Fish net  Bathing suit top (optional)  Long-handled mirror (optional)  If you plan a waterbirth before  December 29, 2018: Purchase or rent the following supplies: You are responsible for providing all supplies listed above. **If you do not have all necessary supplies you cannot have a waterbirth.**   Water Birth Pool (Birth Pool in a Box or [place] for instance)  (Tubs start ~$125)  Single-use disposable tub liner designed for your brand of tub  Electric drain pump to remove water (We recommend 792 gallon per hour or greater pump.)   New garden hose labeled "lead-free", "suitable for drinking water",  Separate garden hose to remove the dirty water  Fish net  Bathing suit top (optional)  Long-handled mirror (optional)  Places to purchase or rent supplies:   [email or web address] for tub purchases and supplies  [email or web address] for tub purchases and supplies  [employer] (www.[email or web address]) $275 for tub rental/set-up & take down/kit   [place] [employer] (http://[email or web address].[email or web address]) Information regarding doulas (labor support) who provide pool rentals  Things that would prevent you from having a waterbirth:  Premature, <37wks  Previous cesarean birth  Presence of thick meconium-stained fluid  Multiple gestation (Twins, triplets, etc.)  Uncontrolled diabetes or gestational diabetes requiring medication  Hypertension requiring medication or diagnosis of pre-eclampsia  Heavy vaginal bleeding  Non-reassuring fetal heart rate  Active infection (MRSA, etc.). Group B Strep is NOT a contraindication for waterbirth.  If your labor has to be induced and induction method requires continuous monitoring of the baby's heart rate  Other risks/issues identified by your obstetrical provider  Please remember that birth is unpredictable. Under certain unforeseeable circumstances your   provider may advise against giving birth in the tub. These decisions will be made on a case-by-case basis and with the safety of you and your baby as our highest priority.    Healthy Weight Gain During Pregnancy, Adult A certain amount of weight gain during pregnancy is normal and healthy. How much weight you should gain depends on your overall health and a measurement called BMI (body mass index). BMI is an estimate of your body fat based on your height and weight. You can use an Freight forwarder to figure out your BMI, or you can ask your health care provider to calculate it for you at your next visit. Your recommended pregnancy weight gain is based on your pre-pregnancy BMI. General guidelines for a healthy total weight gain during pregnancy are listed below. If your BMI at or before the start of your pregnancy is:  Less than 18.5 (underweight), you should gain 28-40 lb (13-18 kg).  18.5-24.9 (normal weight), you should gain 25-35 lb (11-16 kg).  25-29.9 (overweight), you should gain 15-25 lb (7-11 kg).  30 or higher (obese), you should gain 11-20 lb (5-9 kg). These ranges vary depending on your individual health. If you are carrying more than one baby (multiples), it may be safe to gain more weight than these recommendations. If you gain less weight than recommended, that may be safe as long as your baby is growing and developing normally. How can unhealthy weight gain affect me and my baby? Gaining too much weight during pregnancy can lead to pregnancy complications, such as:  A temporary form of diabetes that develops during pregnancy (gestational diabetes).  High blood pressure during pregnancy and protein in your urine (preeclampsia).  High blood pressure during pregnancy without protein in your urine (gestational hypertension).  Your baby having a high weight at birth, which may: ? Raise your risk of having a more difficult delivery or a surgical delivery (cesarean delivery, or C-section). ? Raise your child's risk of developing obesity during childhood. Not gaining enough weight can be life-threatening for your baby, and it may raise your baby's  chances of:  Being born early (preterm).  Growing more slowly than normal during pregnancy (growth restriction).  Having a low weight at birth. What actions can I take to gain a healthy amount of weight during pregnancy? General instructions  Keep track of your weight gain during pregnancy.  Take over-the-counter and prescription medicines only as told by your health care provider. Take all prenatal supplements as directed.  Keep all health care visits during pregnancy (prenatal visits). These visits are a good time to discuss your weight gain. Your health care provider will weigh you at each visit to make sure you are gaining a healthy amount of weight. Nutrition   Eat a balanced, nutrient-rich diet. Eat plenty of: ? Fruits and vegetables, such as berries and broccoli. ? Whole grains, such as millet, barley, whole-wheat breads and cereals, and oatmeal. ? Low-fat dairy products or non-dairy products such as almond milk or rice milk. ? Protein foods, such as lean meat, chicken, eggs, and legumes (such as peas, beans, soybeans, and lentils).  Avoid foods that are fried or have a lot of fat, salt (sodium), or sugar.  Drink enough fluid to keep your urine pale yellow.  Choose healthy snack and drink options when you are at work or on the go: ? Drink water. Avoid soda, sports drinks, and juices that have added sugar. ? Avoid drinks with caffeine, such as coffee and energy  drinks. ? Eat snacks that are high in protein, such as nuts, protein bars, and low-fat yogurt. ? Carry convenient snacks in your purse that do not need refrigeration, such as a pack of trail mix, an apple, or a granola bar.  If you need help improving your diet, work with a health care provider or a diet and nutrition specialist (dietitian). Activity   Exercise regularly, as told by your health care provider. ? If you were active before becoming pregnant, you may be able to continue your regular fitness activities.  ? If you were not active before pregnancy, you may gradually build up to exercising for 30 or more minutes on most days of the week. This may include walking, swimming, or yoga.  Ask your health care provider what activities are safe for you. Talk with your health care provider about whether you may need to be excused from certain school or work activities. Where to find more information Learn more about managing your weight gain during pregnancy from:  American Pregnancy Association: www.americanpregnancy.org  [place] Department of Agriculture pregnancy weight gain calculator: Former[employer].no Summary  Too much weight gain during pregnancy can lead to complications for you and your baby.  Find out your pre-pregnancy BMI to determine how much weight gain is healthy for you.  Eat nutritious foods and stay active.  Keep all of your prenatal visits as told by your health care provider. This information is not intended to replace advice given to you by your health care provider. Make sure you discuss any questions you have with your health care provider. Document Released: 07/13/2017 Document Revised: 07/13/2017 Document Reviewed: 07/13/2017 Elsevier Interactive Patient Education  2019 [employer]. Exercise During Pregnancy For people of all ages, exercise is an important part of being healthy. Exercise improves heart and lung function and helps to maintain strength, flexibility, and a healthy body weight. Exercise also boosts energy levels and elevates mood. For most women, maintaining an exercise routine throughout pregnancy is recommended. It is only on rare occasions and with certain medical conditions or pregnancy complications that women may be asked to limit or avoid exercise during pregnancy. What are some other benefits to exercising during pregnancy? Along with maintaining strength and flexibility, exercising throughout pregnancy can help to:  Keep strength in muscles that are  very important during labor and childbirth.  Decrease low back pain during pregnancy.  Decrease the risk of developing gestational diabetes mellitus (GDM).  Improve blood sugar (glucose) control for women who have GDM.  Decrease the risk of developing preeclampsia. This is a serious condition that causes high blood pressure along with other symptoms, such as swelling and headaches.  Decrease the risk of cesarean delivery.  Speed up the recovery after giving birth. How often should I exercise? Unless your health care provider gives you different instructions, you should try to exercise on most days or all days of the week. In general, try to exercise with moderate intensity for about 150 minutes per week. This can be spread out across several days, such as exercising for 30 minutes per day on 5 days of each week. You can tell that you are exercising at a moderate intensity if you have a higher heart rate and faster breathing, but you are still able to hold a conversation. What types of moderate-intensity exercise are recommended during pregnancy? There are many types of exercise that are safe for you to do during pregnancy. Unless your health care provider gives you different instructions, do a  variety of exercises that safely increase your heart and breathing (cardiopulmonary) rates and help you to build and maintain muscle strength (strength training). You should always be able to talk in full sentences while exercising during pregnancy. Some examples of exercising that is safe to do during pregnancy include:  Brisk walking or hiking.  Swimming.  Water aerobics.  Riding a stationary bike.  Strength training.  Modified yoga or Pilates. Tell your instructor that you are pregnant. Avoid overstretching and avoid lying on your back for long periods of time.  Running or jogging. Only choose this type of exercise if: ? You ran or jogged regularly before your pregnancy. ? You can run or jog  and still talk in complete sentences. What types of exercise should I not do during pregnancy? Depending on your level of fitness and whether you exercised regularly before your pregnancy, you may be advised to limit vigorous-intensity exercise during your pregnancy. You can tell that you are exercising at a vigorous intensity if you are breathing much harder and faster and cannot hold a conversation while exercising. Some examples of exercising that you should avoid during pregnancy include:  Contact sports.  Activities that place you at risk for falling on or being hit in the belly, such as downhill skiing, water skiing, surfing, rock climbing, cycling, gymnastics, and horseback riding.  Scuba diving.  Sky diving.  Yoga or Pilates in a room that is heated to extreme temperatures ("hot yoga" or "hot Pilates").  Jogging or running, unless you ran or jogged regularly before your pregnancy. While jogging or running, you should always be able to talk in full sentences. Do not run or jog so vigorously that you are unable to have a conversation.  If you are not used to exercising at elevation (more than 6,000 feet above sea level), do not do so during your pregnancy. When should I avoid exercising during pregnancy? Certain medical conditions can make it unsafe to exercise during pregnancy, or they may increase your risk of miscarriage or early labor and birth. Some of these conditions include:  Some types of heart disease.  Some types of lung disease.  Placenta previa. This is when the placenta partially or completely covers the opening of the uterus (cervix).  Frequent bleeding from the vagina during your pregnancy.  Incompetent cervix. This is when your cervix does not remain as tightly closed during pregnancy as it should.  Premature labor.  Ruptured membranes. This is when the protective sac (amniotic sac) opens up and amniotic fluid leaks from your vagina.  Severely low blood count  (anemia).  Preeclampsia or pregnancy-caused high blood pressure.  Carrying more than one baby (multiple gestation) and having an additional risk of early labor.  Poorly controlled diabetes.  Being severely underweight or severely overweight.  Intrauterine growth restriction. This is when your baby's growth and development during pregnancy are slower than expected.  Other medical conditions. Ask your health care provider if any apply to you. What else should I know about exercising during pregnancy? You should take these precautions while exercising during pregnancy:  Avoid overheating. ? Wear loose-fitting, breathable clothes. ? Do not exercise in very high temperatures.  Avoid dehydration. Drink enough water before, during, and after exercise to keep your urine clear or pale yellow.  Avoid overstretching. Because of hormone changes during pregnancy, it is easy to overstretch muscles, tendons, and ligaments during pregnancy.  Start slowly and ask your health care provider to recommend types of exercise that are safe  for you, if exercising regularly is new for you. Pregnancy is not a time for exercising to lose weight. When should I seek medical care? You should stop exercising and call your health care provider if you have any unusual symptoms, such as:  Mild uterine contractions or abdominal cramping.  Dizziness that does not improve with rest. When should I seek immediate medical care? You should stop exercising and call your local emergency services (911 in the [place] if you have any unusual symptoms, such as:  Sudden, severe pain in your low back or your belly.  Uterine contractions or abdominal cramping that do not improve with rest.  Chest pain.  Bleeding or fluid leaking from your vagina.  Shortness of breath. This information is not intended to replace advice given to you by your health care provider. Make sure you discuss any questions you have with your health  care provider. Document Released: 10/23/2005 Document Revised: 03/22/2016 Document Reviewed: 12/31/2014 Elsevier Interactive Patient Education  2019 [place]. Activity Restriction During Pregnancy Your health care provider may recommend specific activity restrictions during pregnancy for a variety of reasons. Activity restriction may require that you limit activities that require great effort, such as exercise, lifting, or sex. The type of activity restriction will vary for each person, depending on your risk or the problems you are having. Activity restriction may be recommended for a period of time until your baby is delivered. Why are activity restrictions recommended? Activity restriction may be recommended if:  Your placenta is partially or completely covering the opening of your cervix (placenta previa).  There is bleeding between the wall of the uterus and the amniotic sac in the first trimester of pregnancy (subchorionic hemorrhage).  You went into labor too early (preterm labor).  You have a history of miscarriage.  You have a condition that causes high blood pressure during pregnancy (preeclampsia or eclampsia).  You are pregnant with more than one baby.  Your baby is not growing well. What are the risks? The risks depend on your specific restriction. Strict bed rest has the most physical and emotional risks and is no longer routinely recommended. Risks of strict bed rest include:  Loss of muscle conditioning from not moving.  Blood clots.  Social isolation.  Depression.  Loss of income. Talk with your health care team about activity restriction to decide if it is best for you and your baby. Even if you are having problems during your pregnancy, you may be able to continue with normal levels of activity with careful monitoring by your health care team. Follow these instructions at home: If needed, based on your overall health and the health of your baby, your health  care provider will decide which type of activity restriction is right for you. Activity restrictions may include:  Not lifting anything heavier than 10 pounds (4.5 kg).  Avoiding activities that take a lot of physical effort.  No lifting or straining.  Resting in a sitting position or lying down for periods of time during the day. Pelvic rest may be recommended along with activity restrictions. If pelvic rest is recommended, then:  Do not have sex, an orgasm, or use sexual stimulation.  Do not use tampons. Do not douche. Do not put anything into your vagina.  Do not lift anything that is heavier than 10 lb (4.5 kg).  Avoid activities that require a lot of effort.  Avoid any activity in which your pelvic muscles could become strained, such as squatting. Questions to  ask your health care provider  Why is my activity being limited?  How will activity restrictions affect my body?  Why is rest helpful for me and my baby?  What activities can I do?  When can I return to normal activities? When should I seek immediate medical care? Seek immediate medical care if you have:  Vaginal bleeding.  Vaginal discharge.  Cramping pain in your lower abdomen.  Regular contractions.  A low, dull backache. Summary  Your health care provider may recommend specific activity restrictions during pregnancy for a variety of reasons.  Activity restriction may require that you limit activities such as exercise, lifting, sex, or any other activity that requires great effort.  Discuss the risks and benefits of activity restriction with your health care team to decide if it is best for you and your baby.  Contact your health care provider right away if you think you are having contractions, or if you notice vaginal bleeding, discharge, or cramping. This information is not intended to replace advice given to you by your health care provider. Make sure you discuss any questions you have with your  health care provider. Document Released: 02/17/2011 Document Revised: 02/12/2018 Document Reviewed: 02/12/2018 Elsevier Interactive Patient Education  2019 [employer]. Pregnancy and Sex Your sex life may change during pregnancy as well as after your newborn arrives. It is normal to have questions about sex during pregnancy. All women are affected differently by pregnancy hormones. You may notice an increase or decrease in your sexual drive throughout your pregnancy. Also, your partner's attitude and sexual drive may change. Share the information in this document with your partner. Talk openly about how you feel about sex. When is it safe to have sex during pregnancy? Sex is generally considered safe throughout a normal low-risk pregnancy. Remember:  The fetus is protected by the uterus and the fluid-filled sac that surrounds the fetus (amniotic sac).  The cervix is closed or sealed during pregnancy.  The penis does not reach or harm the fetus during sex.  Sex and orgasms are not thought to cause miscarriages or early labor.  If you use lubricants, use a water-soluble product. What risk factors make it unsafe to have sex while pregnant? The following complications or risk factors may make it necessary to limit sexual activity:  You have a history of miscarriage or preterm labor.  You have bleeding, discharge, fluid leakage, or contractions.  Your placenta may be partially covering or completely covering the opening to the cervix (placenta previa).  Your cervix is weak and opens easily (incompetent cervix).  Your partner has an STD (sexually transmitted disease). Avoid sex with the infected person or use a condom to prevent infection to the fetus.  You are unsure of your partner's sexual history. Avoid sex or use condoms.  You are having twins, triples, or other multiples. Your health care provider will help you determine whether sex during your pregnancy is safe. What practices  are unsafe?  If you engage in oral sex, you should avoid having your partner blow air into your vagina. Although very rare, this can send a dangerous air bubble into your bloodstream.  Anal sex is generally safe during pregnancy, but there can be a risk of spreading bacteria from the rectum and aggravating any hemorrhoids. This information is not intended to replace advice given to you by your health care provider. Make sure you discuss any questions you have with your health care provider. Document Released: 04/12/2010 Document Revised: 06/20/2016  Document Reviewed: 04/13/2016 Elsevier Interactive Patient Education  2019 [place] Trimester of Pregnancy  The first trimester of pregnancy is from week 1 until the end of week 13 (months 1 through 3). During this time, your baby will begin to develop inside you. At 6-8 weeks, the eyes and face are formed, and the heartbeat can be seen on ultrasound. At the end of 12 weeks, all the baby's organs are formed. Prenatal care is all the medical care you receive before the birth of your baby. Make sure you get good prenatal care and follow all of your doctor's instructions. Follow these instructions at home: Medicines  Take over-the-counter and prescription medicines only as told by your doctor. Some medicines are safe and some medicines are not safe during pregnancy.  Take a prenatal vitamin that contains at least 600 micrograms (mcg) of folic acid.  If you have trouble pooping (constipation), take medicine that will make your stool soft (stool softener) if your doctor approves. Eating and drinking   Eat regular, healthy meals.  Your doctor will tell you the amount of weight gain that is right for you.  Avoid raw meat and uncooked cheese.  If you feel sick to your stomach (nauseous) or throw up (vomit): ? Eat 4 or 5 small meals a day instead of 3 large meals. ? Try eating a few soda crackers. ? Drink liquids between meals instead of  during meals.  To prevent constipation: ? Eat foods that are high in fiber, like fresh fruits and vegetables, whole grains, and beans. ? Drink enough fluids to keep your pee (urine) clear or pale yellow. Activity  Exercise only as told by your doctor. Stop exercising if you have cramps or pain in your lower belly (abdomen) or low back.  Do not exercise if it is too hot, too humid, or if you are in a place of great height (high altitude).  Try to avoid standing for long periods of time. Move your legs often if you must stand in one place for a long time.  Avoid heavy lifting.  Wear low-heeled shoes. Sit and stand up straight.  You can have sex unless your doctor tells you not to. Relieving pain and discomfort  Wear a good support bra if your breasts are sore.  Take warm water baths (sitz baths) to soothe pain or discomfort caused by hemorrhoids. Use hemorrhoid cream if your doctor says it is okay.  Rest with your legs raised if you have leg cramps or low back pain.  If you have puffy, bulging veins (varicose veins) in your legs: ? Wear support hose or compression stockings as told by your doctor. ? Raise (elevate) your feet for 15 minutes, 3-4 times a day. ? Limit salt in your food. Prenatal care  Schedule your prenatal visits by the twelfth week of pregnancy.  Write down your questions. Take them to your prenatal visits.  Keep all your prenatal visits as told by your doctor. This is important. Safety  Wear your seat belt at all times when driving.  Make a list of emergency phone numbers. The list should include numbers for family, friends, the hospital, and police and fire departments. General instructions  Ask your doctor for a referral to a local prenatal class. Begin classes no later than at the start of month 6 of your pregnancy.  Ask for help if you need counseling or if you need help with nutrition. Your doctor can give you advice or tell you where  to go for help.   Do not use hot tubs, steam rooms, or saunas.  Do not douche or use tampons or scented sanitary pads.  Do not cross your legs for long periods of time.  Avoid all herbs and alcohol. Avoid drugs that are not approved by your doctor.  Do not use any tobacco products, including cigarettes, chewing tobacco, and electronic cigarettes. If you need help quitting, ask your doctor. You may get counseling or other support to help you quit.  Avoid cat litter boxes and soil used by cats. These carry germs that can cause birth defects in the baby and can cause a loss of your baby (miscarriage) or stillbirth.  Visit your dentist. At home, brush your teeth with a soft toothbrush. Be gentle when you floss. Contact a doctor if:  You are dizzy.  You have mild cramps or pressure in your lower belly.  You have a nagging pain in your belly area.  You continue to feel sick to your stomach, you throw up, or you have watery poop (diarrhea).  You have a bad smelling fluid coming from your vagina.  You have pain when you pee (urinate).  You have increased puffiness (swelling) in your face, hands, legs, or ankles. Get help right away if:  You have a fever.  You are leaking fluid from your vagina.  You have spotting or bleeding from your vagina.  You have very bad belly cramping or pain.  You gain or lose weight rapidly.  You throw up blood. It may look like coffee grounds.  You are around people who have [place] measles, fifth disease, or chickenpox.  You have a very bad headache.  You have shortness of breath.  You have any kind of trauma, such as from a fall or a car accident. Summary  The first trimester of pregnancy is from week 1 until the end of week 13 (months 1 through 3).  To take care of yourself and your unborn baby, you will need to eat healthy meals, take medicines only if your doctor tells you to do so, and do activities that are safe for you and your baby.  Keep all  follow-up visits as told by your doctor. This is important as your doctor will have to ensure that your baby is healthy and growing well. This information is not intended to replace advice given to you by your health care provider. Make sure you discuss any questions you have with your health care provider. Document Released: 04/10/2008 Document Revised: 10/31/2016 Document Reviewed: 10/31/2016 Elsevier Interactive Patient Education  2019 [employer].

## 2019-02-19 NOTE — Progress Notes (Signed)
  Subjective:    Brittany Key is being seen today for her first obstetrical visit.  This is a planned pregnancy. She is at [redacted]w[redacted]d gestation. Her obstetrical history is significant for none. Relationship with FOB Verdon Cummins): significant other, living together. Patient does intend to breast feed. Pregnancy history fully reviewed.  Patient reports occasional nausea. Tolerates nausea without medication.  Review of Systems:   Review of Systems  Constitutional: Negative.   HENT: Negative.   Eyes: Negative.   Respiratory: Negative.   Cardiovascular: Negative.   Gastrointestinal: Negative.  Nausea: occasional, but none today.  Endocrine: Negative.   Genitourinary: Negative.   Musculoskeletal: Negative.   Skin: Negative.   Allergic/Immunologic: Negative.   Neurological: Negative.   Hematological: Negative.   Psychiatric/Behavioral: Negative.     Objective:     BP 122/78   Pulse 72   Wt 178 lb 12.8 oz (81.1 kg)   LMP 11/21/2018   BMI 30.69 kg/m  Physical Exam  Nursing note and vitals reviewed. Constitutional: She is oriented to person, place, and time. She appears well-developed and well-nourished.  HENT:  Head: Normocephalic and atraumatic.  Eyes: Pupils are equal, round, and reactive to light.  Neck: Normal range of motion.  Cardiovascular: Normal rate.  Respiratory: Effort normal and breath sounds normal.  GI: Soft. Bowel sounds are normal.  Genitourinary:    Vulva normal.     Vaginal discharge present.     Genitourinary Comments: Normal labia. Uterus: enlarged, S=D, SE: cervix is smooth, pink, no lesions, small amt of mucoid, white vaginal d/c, VE: closed/long/firm, no CMT or friability, no adnexal tenderness // Pelvis adequate for delivery   Musculoskeletal: Normal range of motion.  Neurological: She is alert and oriented to person, place, and time. She has normal reflexes.  Skin: Skin is warm and dry.  Psychiatric: She has a normal mood and affect. Her behavior is normal.  Judgment and thought content normal.    FHR: 153 bpm by hand-held doppler  Assessment:    Pregnancy: G1P0 Patient Active Problem List   Diagnosis Date Noted  . Supervision of normal first pregnancy, antepartum 01/27/2019    Plan:   Initial labs & early ultrasound reviewed. Panorama drawn. Prenatal vitamins. Problem list reviewed and updated. AFP3 discussed: undecided. Role of ultrasound in pregnancy discussed; fetal survey: ordered. Amniocentesis discussed: not indicated. The nature of [facility] with multiple MDs and other Advanced Practice Providers was explained to patient; also emphasized that residents, students are part of our team. Follow up in 7 weeks for Webex/telehealth 50% of 40 min visit spent on counseling and coordination of care.    Raelyn Mora MSN, CNM 02/19/2019

## 2019-02-28 ENCOUNTER — Encounter: Payer: Self-pay | Admitting: General Practice

## 2019-02-28 NOTE — Telephone Encounter (Signed)
Patient called regarding [employer] results. Patient verified DOB.  Panaroma results given and released to patient on [employer] website.  Clovis Pu, RN

## 2019-03-03 ENCOUNTER — Encounter: Payer: Self-pay | Admitting: General Practice

## 2019-03-06 ENCOUNTER — Encounter: Payer: Self-pay | Admitting: General Practice

## 2019-03-11 ENCOUNTER — Other Ambulatory Visit: Payer: Self-pay | Admitting: *Deleted

## 2019-03-11 DIAGNOSIS — O99019 Anemia complicating pregnancy, unspecified trimester: Secondary | ICD-10-CM

## 2019-03-11 MED ORDER — FERROUS SULFATE 325 (65 FE) MG PO TABS: 325.0000 mg | ORAL_TABLET | Freq: Every day | ORAL | 3 refills | Status: AC

## 2019-03-11 NOTE — Progress Notes (Signed)
Iron tabs sent to pharmacy per Raelyn Mora, CNM order.  Clovis Pu, RN

## 2019-03-12 ENCOUNTER — Other Ambulatory Visit: Payer: Self-pay | Admitting: *Deleted

## 2019-03-12 DIAGNOSIS — Z3400 Encounter for supervision of normal first pregnancy, unspecified trimester: Secondary | ICD-10-CM

## 2019-03-12 NOTE — Progress Notes (Signed)
Enroll in babyscripts.  Clovis Pu, RN

## 2019-04-03 ENCOUNTER — Ambulatory Visit (HOSPITAL_COMMUNITY)
Admission: RE | Admit: 2019-04-03 | Discharge: 2019-04-03 | Disposition: A | Discharge status: Home / Self Care | Payer: BLUE CROSS/BLUE SHIELD | Source: Ambulatory Visit | Attending: Obstetrics and Gynecology | Admitting: Obstetrics and Gynecology

## 2019-04-03 ENCOUNTER — Other Ambulatory Visit: Payer: Self-pay | Admitting: Obstetrics and Gynecology

## 2019-04-03 ENCOUNTER — Other Ambulatory Visit: Payer: Self-pay

## 2019-04-03 DIAGNOSIS — O359XX0 Maternal care for (suspected) fetal abnormality and damage, unspecified, not applicable or unspecified: Secondary | ICD-10-CM

## 2019-04-03 DIAGNOSIS — Z3400 Encounter for supervision of normal first pregnancy, unspecified trimester: Secondary | ICD-10-CM | POA: Insufficient documentation

## 2019-04-03 DIAGNOSIS — Z363 Encounter for antenatal screening for malformations: Secondary | ICD-10-CM

## 2019-04-03 DIAGNOSIS — Z3A19 19 weeks gestation of pregnancy: Secondary | ICD-10-CM

## 2019-04-04 ENCOUNTER — Other Ambulatory Visit (HOSPITAL_COMMUNITY): Payer: Self-pay | Admitting: *Deleted

## 2019-04-04 DIAGNOSIS — Z362 Encounter for other antenatal screening follow-up: Secondary | ICD-10-CM

## 2019-04-16 ENCOUNTER — Encounter: Payer: Self-pay | Admitting: Obstetrics and Gynecology

## 2019-04-16 ENCOUNTER — Other Ambulatory Visit: Payer: Self-pay

## 2019-04-16 ENCOUNTER — Ambulatory Visit (INDEPENDENT_AMBULATORY_CARE_PROVIDER_SITE_OTHER): Payer: BC Managed Care – PPO | Admitting: Obstetrics and Gynecology

## 2019-04-16 VITALS — BP 110/73 | HR 82 | Temp 98.8°F | Wt 185.0 lb

## 2019-04-16 DIAGNOSIS — Z3402 Encounter for supervision of normal first pregnancy, second trimester: Secondary | ICD-10-CM | POA: Diagnosis not present

## 2019-04-16 DIAGNOSIS — Z3A20 20 weeks gestation of pregnancy: Secondary | ICD-10-CM

## 2019-04-16 DIAGNOSIS — Z3400 Encounter for supervision of normal first pregnancy, unspecified trimester: Secondary | ICD-10-CM

## 2019-04-16 NOTE — Progress Notes (Signed)
   PRENATAL VISIT NOTE  Subjective:  Brittany Key is a 28 y.o. G1P0 at [redacted]w[redacted]d being seen today for ongoing prenatal care.  She is currently monitored for the following issues for this low-risk pregnancy and has Supervision of normal first pregnancy, antepartum on their problem list.  Patient reports "feeling very anxious over the possibility that she may not be able to have a water birth or use nitrous oxide." She desires to hire a doula, but unsure of [facility] visitation policy during the IEPPI-95 pandemic.  Contractions: Not present. Vag. Bleeding: None.  Movement: Present. Denies leaking of fluid.   The following portions of the patient's history were reviewed and updated as appropriate: allergies, current medications, past family history, past medical history, past social history, past surgical history and problem list.   Objective:   Vitals:   04/16/19 1426  BP: 110/73  Pulse: 82  Temp: 98.8 F (37.1 C)  Weight: 185 lb (83.9 kg)    Fetal Status: Fetal Heart Rate (bpm): 152 Fundal Height: 22 cm Movement: Present  Presentation: Undeterminable  General:  Alert, oriented and cooperative. Patient is in no acute distress.  Skin: Skin is warm and dry. No rash noted.   Cardiovascular: Normal heart rate noted  Respiratory: Normal respiratory effort, no problems with respiration noted  Abdomen: Soft, gravid, appropriate for gestational age.  Pain/Pressure: Absent     Pelvic: Cervical exam deferred        Extremities: Normal range of motion.  Edema: None  Mental Status: Normal mood and affect. Normal behavior. Normal judgment and thought content.   Assessment and Plan:  Pregnancy: G1P0 at [redacted]w[redacted]d Supervision of normal first pregnancy, antepartum - BP cuff given for video visits  visual and verbal instructions given to patient  return demonstration given by patient  Preterm labor symptoms and general obstetric precautions including but not limited to vaginal bleeding, contractions, leaking of  fluid and fetal movement were reviewed in detail with the patient. Please refer to After Visit Summary for other counseling recommendations.   Return in about 4 weeks (around 05/14/2019) for Return OB - My Chart video.  Future Appointments  Date Time Provider [place]  05/14/2019  1:50 PM [name], CNM [facility] None  05/15/2019  1:00 PM [place] 3 [facility]  06/12/2019  8:10 AM [name], CNM [facility] None    [name], CNM

## 2019-05-14 ENCOUNTER — Telehealth: Payer: BC Managed Care – PPO | Admitting: Obstetrics and Gynecology

## 2019-05-15 ENCOUNTER — Other Ambulatory Visit: Payer: Self-pay

## 2019-05-15 ENCOUNTER — Ambulatory Visit (HOSPITAL_COMMUNITY)
Admission: RE | Admit: 2019-05-15 | Discharge: 2019-05-15 | Disposition: A | Discharge status: Home / Self Care | Payer: BC Managed Care – PPO | Source: Ambulatory Visit | Attending: Obstetrics and Gynecology | Admitting: Obstetrics and Gynecology

## 2019-05-15 DIAGNOSIS — O359XX0 Maternal care for (suspected) fetal abnormality and damage, unspecified, not applicable or unspecified: Secondary | ICD-10-CM

## 2019-05-15 DIAGNOSIS — Z362 Encounter for other antenatal screening follow-up: Secondary | ICD-10-CM | POA: Diagnosis present

## 2019-05-15 DIAGNOSIS — Z148 Genetic carrier of other disease: Secondary | ICD-10-CM

## 2019-05-15 DIAGNOSIS — Z3A25 25 weeks gestation of pregnancy: Secondary | ICD-10-CM | POA: Diagnosis not present

## 2019-05-19 ENCOUNTER — Telehealth: Payer: Self-pay | Admitting: *Deleted

## 2019-05-19 NOTE — Telephone Encounter (Signed)
-----   Message from [name], [place] sent at 05/18/2019  8:38 AM EDT ----- Regarding: Doulas at [facility] Will you let her know that doulas are expected to be allowed to apply for entry at [facility] as a healthcare team member. They will need to email Marcille Buffy at [email or web address] for further instructions.  Thanks, Ro

## 2019-05-22 ENCOUNTER — Other Ambulatory Visit: Payer: Self-pay

## 2019-05-22 ENCOUNTER — Ambulatory Visit (INDEPENDENT_AMBULATORY_CARE_PROVIDER_SITE_OTHER): Payer: BC Managed Care – PPO | Admitting: Women's Health

## 2019-05-22 ENCOUNTER — Other Ambulatory Visit (HOSPITAL_COMMUNITY)
Admission: RE | Admit: 2019-05-22 | Discharge: 2019-05-22 | Disposition: A | Discharge status: Home / Self Care | Payer: BC Managed Care – PPO | Source: Ambulatory Visit | Attending: Obstetrics and Gynecology | Admitting: Obstetrics and Gynecology

## 2019-05-22 ENCOUNTER — Encounter: Payer: Self-pay | Admitting: General Practice

## 2019-05-22 VITALS — BP 113/67 | HR 86 | Temp 98.4°F | Wt 194.8 lb

## 2019-05-22 DIAGNOSIS — Z3A26 26 weeks gestation of pregnancy: Secondary | ICD-10-CM

## 2019-05-22 DIAGNOSIS — N898 Other specified noninflammatory disorders of vagina: Secondary | ICD-10-CM | POA: Insufficient documentation

## 2019-05-22 DIAGNOSIS — Z3400 Encounter for supervision of normal first pregnancy, unspecified trimester: Secondary | ICD-10-CM

## 2019-05-22 DIAGNOSIS — O26892 Other specified pregnancy related conditions, second trimester: Secondary | ICD-10-CM | POA: Diagnosis not present

## 2019-05-22 DIAGNOSIS — O26899 Other specified pregnancy related conditions, unspecified trimester: Secondary | ICD-10-CM | POA: Diagnosis present

## 2019-05-22 LAB — POCT URINALYSIS DIPSTICK OB
Bilirubin, UA: NEGATIVE
Blood, UA: NEGATIVE
Glucose, UA: NEGATIVE
Ketones, UA: NEGATIVE
Leukocytes, UA: NEGATIVE
Nitrite, UA: NEGATIVE
POC,PROTEIN,UA: NEGATIVE
Spec Grav, UA: 1.025 (ref 1.010–1.025)
Urobilinogen, UA: 0.2 E.U./dL
pH, UA: 7 (ref 5.0–8.0)

## 2019-05-22 NOTE — Progress Notes (Signed)
Subjective:  Brittany Key is a 28 y.o. G1P0 at [redacted]w[redacted]d being seen today for ongoing prenatal care.  She is currently monitored for the following issues for this low-risk pregnancy and has Supervision of normal first pregnancy, antepartum and Vaginal discharge during pregnancy, antepartum on their problem list.   Patient reports no complaints.  Contractions: Not present. Vag. Bleeding: None.  Movement: Present. Denies leaking of fluid.   Pt is requesting UA to check for protein in urine today. Pt states she is used to clinics doing them every visit.  Pt also requesting vaginal swabs. Pt reports creamy, white discharge that is sometimes clumped. Pt denies odor/itching/burning/vaginal/vulvar redness.  Pt reports she did not receive a prescription for iron, but confirms she has been taking PNVs.  The following portions of the patient's history were reviewed and updated as appropriate: allergies, current medications, past family history, past medical history, past social history, past surgical history and problem list. Problem list updated.  Objective:   Vitals:   05/22/19 0827  BP: 113/67  Pulse: 86  Temp: 98.4 F (36.9 C)  Weight: 194 lb 12.8 oz (88.4 kg)    Fetal Status: Fetal Heart Rate (bpm): 145   Movement: Present     General:  Alert, oriented and cooperative. Patient is in no acute distress.  Skin: Skin is warm and dry. No rash noted.   Cardiovascular: Normal heart rate noted  Respiratory: Normal respiratory effort, no problems with respiration noted  Abdomen: Soft, gravid, appropriate for gestational age. Pain/Pressure: Absent     Pelvic: Vag. Bleeding: None Vag D/C Character: White   Cervical exam deferred        Extremities: Normal range of motion.  Edema: None  Mental Status: Normal mood and affect. Normal behavior. Normal judgment and thought content.   Urinalysis:     normal  Assessment and Plan:  Pregnancy: G1P0 at [redacted]w[redacted]d  1. Supervision of normal first pregnancy,  antepartum -discussed utility/reasons for UA in pregnancy and pt advised is not necessary at each visit unless certain health conditions/concerns are present. -pt needs Tdap @28wks , too early at today's appt, pt advised to either return to clinic @28wks  for Tdap only, can come at time of virtual visit and continue with virtual visit in car, or can switch next visit to in-person visit. Pt will consider and schedule appropriately with front desk staff. -discussed IUDs for contraception, literature given -pediatric providers list given -FKCs discussed, info given - Glucose Tolerance, 2 Hours w/1 Hour - RPR - HIV Antibody (routine testing w rflx) - CBC (will review results and send RX for iron PRN) - Cervicovaginal ancillary only( [place]) - Culture, OB Urine -UA (normal)  2. Vaginal discharge during pregnancy, antepartum -discussed normal discharge in pregnancy - Cervicovaginal ancillary only( [place])  Preterm labor symptoms and general obstetric precautions including but not limited to vaginal bleeding, contractions, leaking of fluid and fetal movement were reviewed in detail with the patient. Please refer to After Visit Summary for other counseling recommendations.  Return in about 4 weeks (around 06/19/2019) for Pt to decide on in-person visit for next visit for TDAP or schedule seperate appt for TDAP admin.Clarisa Fling, NP

## 2019-05-22 NOTE — Patient Instructions (Signed)
Intrauterine Device Information An intrauterine device (IUD) is a medical device that is inserted in the uterus to prevent pregnancy. It is a small, T-shaped device that has one or two nylon strings hanging down from it. The strings hang out of the lower part of the uterus (cervix) to allow for future IUD removal. There are two types of IUDs available:  Hormone IUD. This type of IUD is made of plastic and contains the hormone progestin (synthetic progesterone). A hormone IUD may last 3-5 years.  Copper IUD. This type of IUD has copper wire wrapped around it. A copper IUD may last up to 10 years. How is an IUD inserted? An IUD is inserted through the vagina and placed into the uterus with a minor medical procedure. The exact procedure for IUD insertion may vary among health care providers and hospitals. How does an IUD work? Synthetic progesterone in a hormonal IUD prevents pregnancy by:  Thickening cervical mucus to prevent sperm from entering the uterus.  Thinning the uterine lining to prevent a fertilized egg from being implanted there. Copper in a copper IUD prevents pregnancy by making the uterus and fallopian tubes produce a fluid that kills sperm. What are the advantages of an IUD? Advantages of either type of IUD  It is highly effective in preventing pregnancy.  It is reversible. You can become pregnant shortly after the IUD is removed.  It is low-maintenance and can stay in place for a long time.  There are no estrogen-related side effects.  It can be used when breastfeeding.  It is not associated with weight gain.  It can be inserted right after childbirth, an abortion, or a miscarriage. Advantages of a hormone IUD  If it is inserted within 7 days of your period starting, it works right after it is inserted. If the hormone IUD is inserted at any other time in your cycle, you will need to use a backup method of birth control for 7 days after insertion.  It can make  menstrual periods lighter.  It can reduce menstrual cramping.  It can be used for 3-5 years. Advantages of a copper IUD  It works right after it is inserted.  It can be used as a form of emergency birth control if it is inserted within 5 days after having unprotected sex.  It does not interfere with your body's natural hormones.  It can be used for 10 years. What are the disadvantages of an IUD?  An IUD may cause irregular menstrual bleeding for a period of time after insertion.  You may have pain during insertion and have cramping and vaginal bleeding after insertion.  An IUD may cut the uterus (uterine perforation) when it is inserted. This is rare.  An IUD may cause pelvic inflammatory disease (PID), which is an infection in the uterus and fallopian tubes. This is rare, and it usually happens during the first 20 days after the IUD is inserted.  A copper IUD can make your menstrual flow heavier and more painful. How is an IUD removed?  You will lie on your back with your knees bent and your feet in footrests (stirrups).  A device will be inserted into your vagina to spread apart the vaginal walls (speculum). This will allow your health care provider to see the strings attached to the IUD.  Your health care provider will use a small instrument (forceps) to grasp the IUD strings and pull firmly until the IUD is removed. You may have some discomfort   when the IUD is removed. Your health care provider may recommend taking over-the-counter pain relievers, such as ibuprofen, before the procedure. You may also have minor spotting for a few days after the procedure. The exact procedure for IUD removal may vary among health care providers and hospitals. Is the IUD right for me? Your health care provider will make sure you are a good candidate for an IUD and will discuss the advantages, disadvantages, and possible side effects with you. Summary  An intrauterine device (IUD) is a medical  device that is inserted in the uterus to prevent pregnancy. It is a small, T-shaped device that has one or two nylon strings hanging down from it.  A hormone IUD contains the hormone progestin (synthetic progesterone). A copper IUD has copper wire wrapped around it.  Synthetic progesterone in a hormone IUD prevents pregnancy by thickening cervical mucus and thinning the walls of the uterus. Copper in a copper IUD prevents pregnancy by making the uterus and fallopian tubes produce a fluid that kills sperm.  A hormone IUD can be left in place for 3-5 years. A copper IUD can be left in place for up to 10 years.  An IUD is inserted and removed by a health care provider. You may feel some pain during insertion and removal. Your health care provider may recommend taking over-the-counter pain medicine, such as ibuprofen, before an IUD procedure. This information is not intended to replace advice given to you by your health care provider. Make sure you discuss any questions you have with your health care provider. Document Released: 09/26/2004 Document Revised: 10/05/2017 Document Reviewed: 11/21/2016 Elsevier Patient Education  2020 Elsevier Inc.  AREA PEDIATRIC/FAMILY PRACTICE PHYSICIANS  [facility] [address] [place], [place] [address] Phone - [phone]   Fax - [phone]  JACK AMOS [address] [place], [place]  [address] Phone - [phone]   Fax - [phone]  [facility] [address] [place], [place]  [address] Phone - [phone]   Fax - [phone]  [facility] [address] [place], [place]  [address] Phone - [phone]   Fax - [phone]  [facility] [address], [address] [place], [place]  [address] Phone - [phone]   Fax - [phone]  [facility] [address], [address] [place], [place]  [address] Phone - [phone]   Fax - [phone]  [facility] [place] [address], [address] [place], [place]  [address] Phone - [phone]   Fax - [phone]  [facility] [address] [place], [place]  [address] Phone - [phone]   Fax - [phone]  [facility] [address] [place], [place]  [address] Phone - [phone]   Fax - [phone] [facility] [address] [place], [place]  [address] Phone - [phone]   Fax - [phone]  [facility] [address] [place], [place]  [address] Phone - [phone]   Fax - [phone]  [facility] [address], [address] [place], [place]  [address] Phone - [phone]   Fax - [phone]  [facility] [address], [address] [place], [place]  [address] Phone - [phone]   Fax - [phone]  [facility] [address], [address] [place], [place]  [address] Phone - [phone]  [facility] [address] [place], [place]  [address] Phone - [phone]   Fax - [phone]  [facility] [address], [address] [place], [place]  [address] Phone - [phone]   Fax - [phone]  [facility] [address] [place], [place]  [address] Phone - [phone]   Fax - [phone]  [facility] [address] [place], [place]  [address] Phone - [phone]   Fax - [phone]   [facility] [address], [address] [place], [place]  [address] Phone - [phone]   Fax - [phone]  [facility] [address], [address] [place], [place]  [address] Phone - [phone]   Fax - [phone]  DAVID RUBIN [address] [place], [place]  [address] Phone - [phone]   Fax - [phone]  [facility] [address] [place], [place]  [address] Phone  [phone]   Fax - [phone]  [facility] [address] [place], [place]  [address] Phone - [phone]   Fax - [phone] [facility] [place] [address] [place], [place]  [address] Phone - [phone]   Fax - [phone]  [facility] [address] [place], [place]  [address] Phone - [phone]   Fax - [phone]  [facility] [place] [address], [address] [place], [place]  [address] Phone - [phone]   Fax - [phone]    Fetal Movement Counts Patient Name: ________________________________________________ Patient Due Date: ____________________ What is a fetal movement count?  A fetal movement count is the number of times that you feel your baby move during a certain amount of time. This may also be called a fetal kick count. A fetal movement count is recommended for every pregnant woman. You may be asked to start counting fetal movements as early as week 28 of your pregnancy. Pay attention to when your baby is most active. You may notice your baby's sleep and wake cycles. You may also notice things that make your baby move more. You should do a fetal movement count:  When your baby is normally most active.  At the same time each day. A good time to count movements is while you are resting, after having something to eat and drink. How do I count fetal movements? 1. Find a quiet, comfortable area. Sit, or lie down on your side. 2. Write down the date, the start time and stop time, and the number of movements that you felt between those two times. Take this information with you to your health care visits. 3. For 2 hours, count kicks, flutters, swishes, rolls, and jabs. You should feel at least 10 movements during 2 hours. 4. You may stop counting after you have felt 10 movements. 5. If you do not feel 10 movements in 2 hours, have something to eat and drink. Then, keep resting and counting for 1  hour. If you feel at least 4 movements during that hour, you may stop counting. Contact a health care provider if:  You feel fewer than 4 movements in 2 hours.  Your baby is not moving like he or she usually does. Date: ____________ Start time: ____________ Stop time: ____________ Movements: ____________ Date: ____________ Start time: ____________ Stop time: ____________ Movements: ____________ Date: ____________ Start time: ____________ Stop time: ____________ Movements: ____________ Date: ____________ Start time: ____________ Stop time: ____________ Movements: ____________ Date: ____________ Start time: ____________ Stop time: ____________ Movements: ____________  Date: ____________ Start time: ____________ Stop time: ____________ Movements: ____________ Date: ____________ Start time: ____________ Stop time: ____________ Movements: ____________ Date: ____________ Start time: ____________ Stop time: ____________ Movements: ____________ Date: ____________ Start time: ____________ Stop time: ____________ Movements: ____________ This information is not intended to replace advice given to you by your health care provider. Make sure you discuss any questions you have with your health care provider. Document Released: 11/22/2006 Document Revised: 11/12/2018 Document Reviewed: 12/02/2015 Elsevier Patient Education  2020 [employer].

## 2019-05-23 LAB — CBC
Hematocrit: 31.6 % — ABNORMAL LOW (ref 34.0–46.6)
Hemoglobin: 10.5 g/dL — ABNORMAL LOW (ref 11.1–15.9)
MCH: 28 pg (ref 26.6–33.0)
MCHC: 33.2 g/dL (ref 31.5–35.7)
MCV: 84 fL (ref 79–97)
Platelets: 216 10*3/uL (ref 150–450)
RBC: 3.75 x10E6/uL — ABNORMAL LOW (ref 3.77–5.28)
RDW: 13.1 % (ref 11.7–15.4)
WBC: 10.4 10*3/uL (ref 3.4–10.8)

## 2019-05-23 LAB — GLUCOSE TOLERANCE, 2 HOURS W/ 1HR
Glucose, 1 hour: 132 mg/dL (ref 65–179)
Glucose, 2 hour: 128 mg/dL (ref 65–152)
Glucose, Fasting: 91 mg/dL (ref 65–91)

## 2019-05-23 LAB — RPR: RPR Ser Ql: NONREACTIVE

## 2019-05-23 LAB — HIV ANTIBODY (ROUTINE TESTING W REFLEX): HIV Screen 4th Generation wRfx: NONREACTIVE

## 2019-05-24 LAB — URINE CULTURE, OB REFLEX: Organism ID, Bacteria: NO GROWTH

## 2019-05-24 LAB — CULTURE, OB URINE

## 2019-05-26 LAB — CERVICOVAGINAL ANCILLARY ONLY
Bacterial vaginitis: NEGATIVE
Candida vaginitis: NEGATIVE
Chlamydia: NEGATIVE
Neisseria Gonorrhea: NEGATIVE
Trichomonas: NEGATIVE

## 2019-05-31 ENCOUNTER — Encounter: Payer: Self-pay | Admitting: Internal Medicine

## 2019-06-03 ENCOUNTER — Encounter: Payer: Self-pay | Admitting: Nurse Practitioner

## 2019-06-03 ENCOUNTER — Ambulatory Visit (INDEPENDENT_AMBULATORY_CARE_PROVIDER_SITE_OTHER): Payer: BC Managed Care – PPO | Admitting: Nurse Practitioner

## 2019-06-03 ENCOUNTER — Other Ambulatory Visit: Payer: Self-pay

## 2019-06-03 VITALS — BP 100/62 | HR 83 | Temp 98.1°F | Ht 64.4 in | Wt 196.6 lb

## 2019-06-03 DIAGNOSIS — H6690 Otitis media, unspecified, unspecified ear: Secondary | ICD-10-CM | POA: Diagnosis not present

## 2019-06-03 MED ORDER — AMOXICILLIN 875 MG PO TABS: 875.0000 mg | ORAL_TABLET | Freq: Two times a day (BID) | ORAL | 0 refills | Status: AC

## 2019-06-03 NOTE — Progress Notes (Signed)
Subjective:     Patient ID: Brittany Key , female    DOB: 02/12/1991 , 28 y.o.   MRN: [id number]   Chief Complaint  Patient presents with  . Otalgia    patient stated her ears have been hurting her for the past week and a half. pt states her ear feels sore and swollen and when she yawns it hurts worse    HPI  [redacted] weeks gestation   Otalgia  There is pain in the left (has occurred to both but left worse) ear. This is a new problem. The current episode started 1 to 4 weeks ago (2 weeks). The problem has been gradually worsening. There has been no fever. The pain is moderate. Pertinent negatives include no abdominal pain, coughing, ear discharge, headaches or sore throat. She has tried nothing for the symptoms. The treatment provided no relief. There is no history of a chronic ear infection.     Past Medical History:  Diagnosis Date  . Ruptured cyst of ovary      Family History  Problem Relation Age of Onset  . Hypertension Mother   . Diabetes Father   . Hypertension Father   . Diabetes Brother      Current Outpatient Medications:  .  ferrous sulfate (FERROUSUL) 325 (65 FE) MG tablet, Take 1 tablet (325 mg total) by mouth daily with breakfast., Disp: 90 tablet, Rfl: 3 .  Prenatal Vit-Fe Fumarate-FA (PRENATAL COMPLETE) 14-0.4 MG TABS, Take 1 tablet by mouth daily., Disp: 60 each, Rfl: 0   Allergies  Allergen Reactions  . Pineapple Other (See Comments)    unknown  . Latex Rash     Review of Systems  Constitutional: Negative.   HENT: Positive for ear pain. Negative for ear discharge and sore throat.   Respiratory: Negative.  Negative for cough.   Cardiovascular: Negative.  Negative for chest pain, palpitations and leg swelling.  Gastrointestinal: Negative for abdominal pain.  Neurological: Negative for dizziness and headaches.  Psychiatric/Behavioral: Negative.      Today's Vitals   06/03/19 0846  BP: 100/62  Pulse: 83  Temp: 98.1 F (36.7 C)  TempSrc: Oral   Weight: 196 lb 9.6 oz (89.2 kg)  Height: 5' 4.4" (1.636 m)  PainSc: 4   PainLoc: Ear   Body mass index is 33.33 kg/m.   Objective:  Physical Exam Vitals signs reviewed.  Constitutional:      Appearance: Normal appearance.  HENT:     Right Ear: Tympanic membrane, ear canal and external ear normal. There is no impacted cerumen.     Left Ear: Tympanic membrane, ear canal and external ear normal. There is no impacted cerumen.     Nose: Nose normal. No congestion.  Cardiovascular:     Rate and Rhythm: Normal rate and regular rhythm.     Pulses: Normal pulses.     Heart sounds: Normal heart sounds. No murmur.  Skin:    General: Skin is warm and dry.     Capillary Refill: Capillary refill takes less than 2 seconds.  Neurological:     General: No focal deficit present.     Mental Status: She is alert and oriented to person, place, and time.  Psychiatric:        Mood and Affect: Mood normal.        Behavior: Behavior normal.        Thought Content: Thought content normal.        Judgment: Judgment normal.  Assessment And Plan:     1. Acute otitis media, unspecified otitis media type  Will treat with amoxicillin orally.  She is to call if has any symptoms of yeast infection.   - amoxicillin (AMOXIL) 875 MG tablet; Take 1 tablet (875 mg total) by mouth 2 (two) times daily.  Dispense: 14 tablet; Refill: 0   Minette Brine, FNP    THE PATIENT IS ENCOURAGED TO PRACTICE SOCIAL DISTANCING DUE TO THE COVID-19 PANDEMIC.

## 2019-06-03 NOTE — Patient Instructions (Signed)
Otitis Media, Adult  Otitis media means that the middle ear is red and swollen (inflamed) and full of fluid. The condition usually goes away on its own. Follow these instructions at home:  Take over-the-counter and prescription medicines only as told by your doctor.  If you were prescribed an antibiotic medicine, take it as told by your doctor. Do not stop taking the antibiotic even if you start to feel better.  Keep all follow-up visits as told by your doctor. This is important. Contact a doctor if:  You have bleeding from your nose.  There is a lump on your neck.  You are not getting better in 5 days.  You feel worse instead of better. Get help right away if:  You have pain that is not helped with medicine.  You have swelling, redness, or pain around your ear.  You get a stiff neck.  You cannot move part of your face (paralyzed).  You notice that the bone behind your ear hurts when you touch it.  You get a very bad headache. Summary  Otitis media means that the middle ear is red, swollen, and full of fluid.  This condition usually goes away on its own. In some cases, treatment may be needed.  If you were prescribed an antibiotic medicine, take it as told by your doctor. This information is not intended to replace advice given to you by your health care provider. Make sure you discuss any questions you have with your health care provider. Document Released: 04/10/2008 Document Revised: 10/05/2017 Document Reviewed: 11/13/2016 Elsevier Patient Education  2020 Elsevier Inc.  

## 2019-06-12 ENCOUNTER — Encounter: Payer: BC Managed Care – PPO | Admitting: Obstetrics and Gynecology

## 2019-06-17 ENCOUNTER — Telehealth: Payer: Self-pay | Admitting: General Practice

## 2019-06-17 NOTE — Telephone Encounter (Signed)
Patient called and cancelled appointment for tomorrow 06/18/2019 at 3:10pm.  Pt also declining Tdap vaccine.  Informed patient that RN will be notified of declining Tdap.  Pt verbalized understanding.

## 2019-06-26 ENCOUNTER — Telehealth: Payer: BC Managed Care – PPO | Admitting: Obstetrics and Gynecology

## 2019-07-02 ENCOUNTER — Encounter: Payer: Self-pay | Admitting: Obstetrics and Gynecology

## 2019-07-02 ENCOUNTER — Other Ambulatory Visit: Payer: Self-pay

## 2019-07-02 ENCOUNTER — Telehealth (INDEPENDENT_AMBULATORY_CARE_PROVIDER_SITE_OTHER): Payer: BC Managed Care – PPO | Admitting: Obstetrics and Gynecology

## 2019-07-02 VITALS — BP 117/69 | HR 89

## 2019-07-02 DIAGNOSIS — Z3A31 31 weeks gestation of pregnancy: Secondary | ICD-10-CM

## 2019-07-02 DIAGNOSIS — Z3400 Encounter for supervision of normal first pregnancy, unspecified trimester: Secondary | ICD-10-CM

## 2019-07-02 DIAGNOSIS — Z3403 Encounter for supervision of normal first pregnancy, third trimester: Secondary | ICD-10-CM

## 2019-07-02 NOTE — Progress Notes (Signed)
   MY CHART VIDEO VIRTUAL OBSTETRICS VISIT ENCOUNTER NOTE  I connected with Brittany Key on 07/02/19 at  1:50 PM EDT by My Chart video at home and verified that I am speaking with the correct person using two identifiers.   I discussed the limitations, risks, security and privacy concerns of performing an evaluation and management service by My Chart video and the availability of in person appointments. I also discussed with the patient that there may be a patient responsible charge related to this service. The patient expressed understanding and agreed to proceed.  Subjective:  Brittany Key is a 28 y.o. G1P0 at [redacted]w[redacted]d being followed for ongoing prenatal care.  She is currently monitored for the following issues for this low-risk pregnancy and has Supervision of normal first pregnancy, antepartum and Vaginal discharge during pregnancy, antepartum on their problem list.  Patient reports no complaints. She is not taking any iron supplements. She is only taking [place] PNV (OTC). Reports fetal movement. Denies any contractions, bleeding or leaking of fluid.   The following portions of the patient's history were reviewed and updated as appropriate: allergies, current medications, past family history, past medical history, past social history, past surgical history and problem list.   Objective:   General:  Alert, oriented and cooperative.   Mental Status: Normal mood and affect perceived. Normal judgment and thought content.  Rest of physical exam deferred due to type of encounter  BP 117/69 (BP Location: Left Arm, Patient Position: Sitting)   Pulse 89   LMP 11/21/2018  **Done by patient's own at home BP cuff  No weight today -- patient doesn't have a scale  Assessment and Plan:  Pregnancy: G1P0 at [redacted]w[redacted]d  Supervision of normal first pregnancy, antepartum - Anticipatory guidance for GBS and cx check at nv. - Advised that her doula must complete the [facility] application process in  order to be allowed to be at her bedside in addition to her S.O.  Preterm labor symptoms and general obstetric precautions including but not limited to vaginal bleeding, contractions, leaking of fluid and fetal movement were reviewed in detail with the patient.  I discussed the assessment and treatment plan with the patient. The patient was provided an opportunity to ask questions and all were answered. The patient agreed with the plan and demonstrated an understanding of the instructions. The patient was advised to call back or seek an in-person office evaluation/go to [facility]U at [facility] for any urgent or concerning symptoms. Please refer to After Visit Summary for other counseling recommendations.   I provided 10 minutes of non-face-to-face time during this encounter. There was 5 minutes of chart review time spent prior to this encounter. Total time spent = 15 minutes.  Return in about 4 weeks (around 07/30/2019) for Return OB w/GBS.  Future Appointments  Date Time Provider [place]  07/31/2019 11:10 AM [name], CNM [facility] None    [name], [place] for [employer], [place]

## 2019-07-31 ENCOUNTER — Encounter: Payer: BC Managed Care – PPO | Admitting: Obstetrics and Gynecology

## 2019-08-04 IMAGING — US OBSTETRIC <14 WK ULTRASOUND
1 series · 13 of 13 positions shown · non-contrast
Comparison: None

CLINICAL DATA: Early pregnancy, dating and viability; EGA 10 weeks
0 days by LMP of 11/21/2018

EXAM:
OBSTETRIC <14 WK ULTRASOUND
TECHNIQUE: Transabdominal ultrasound was performed for evaluation of the
gestation as well as the maternal uterus and adnexal regions.

[Series 1: obstetric <14 wk ultrasound · 13 of 13 slices shown]
[im 1/13]
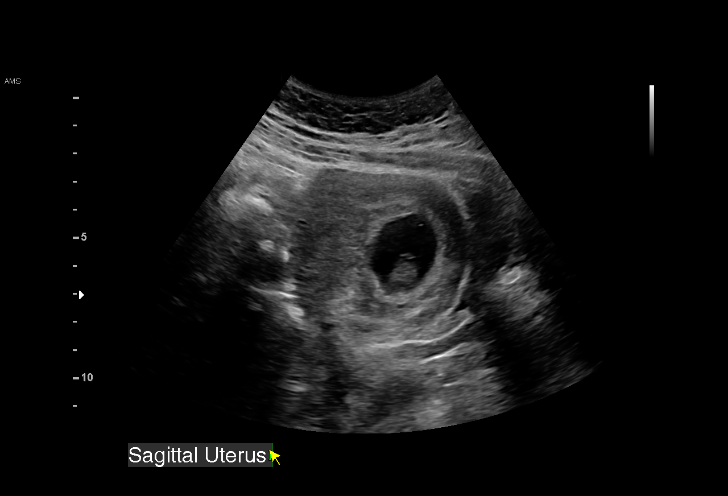
[im 2/13]
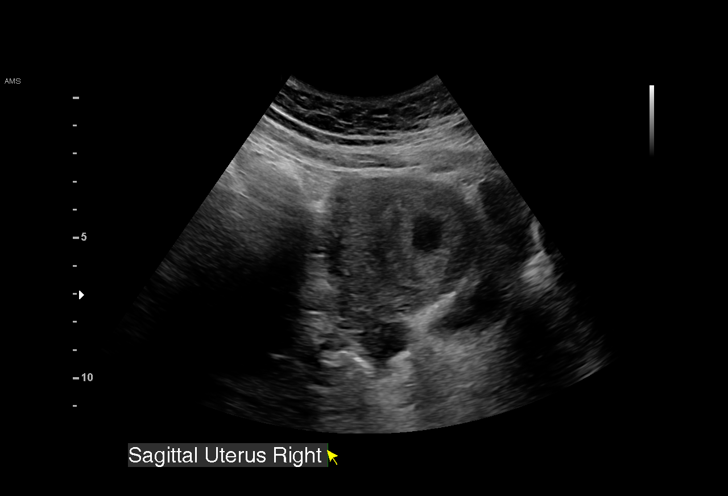
[im 3/13]
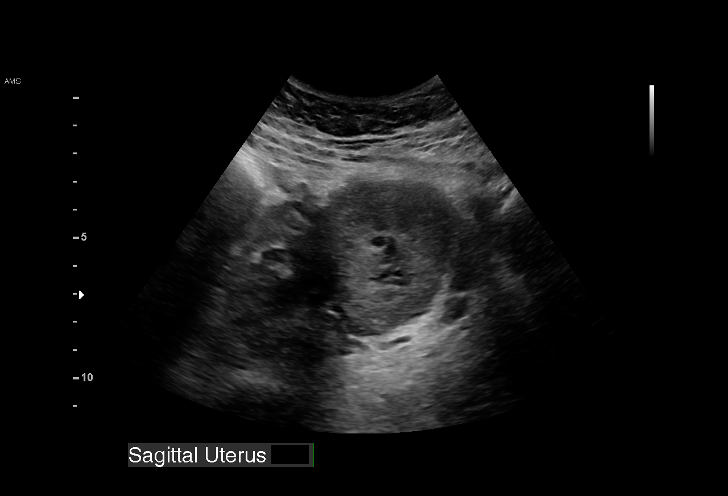
[im 4/13]
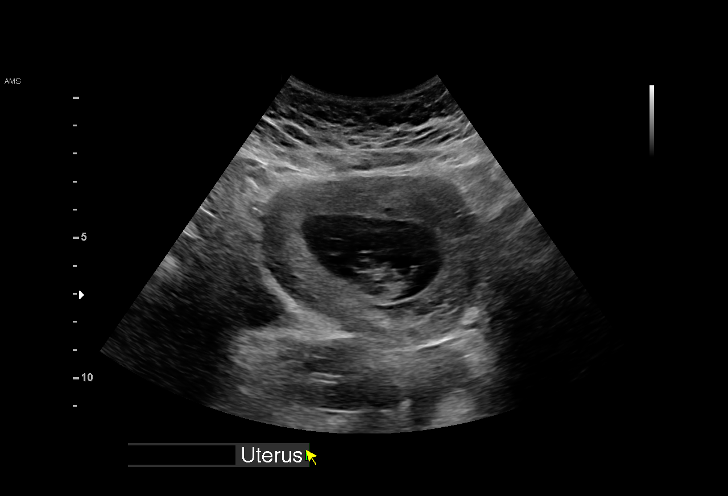
[im 5/13]
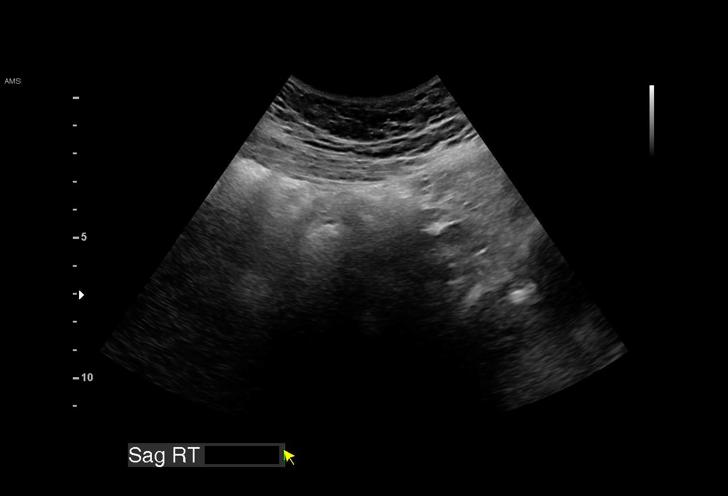
[im 6/13]
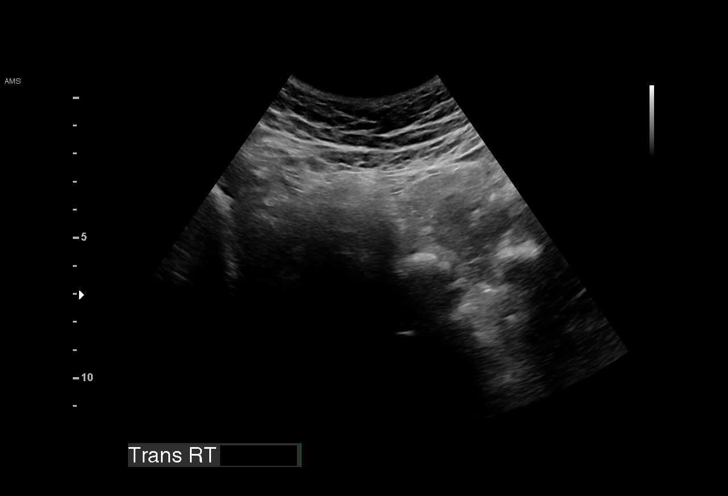
[im 7/13]
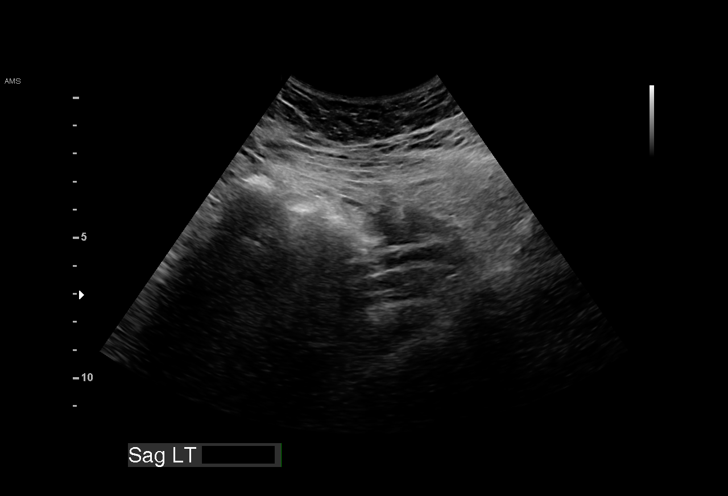
[im 8/13]
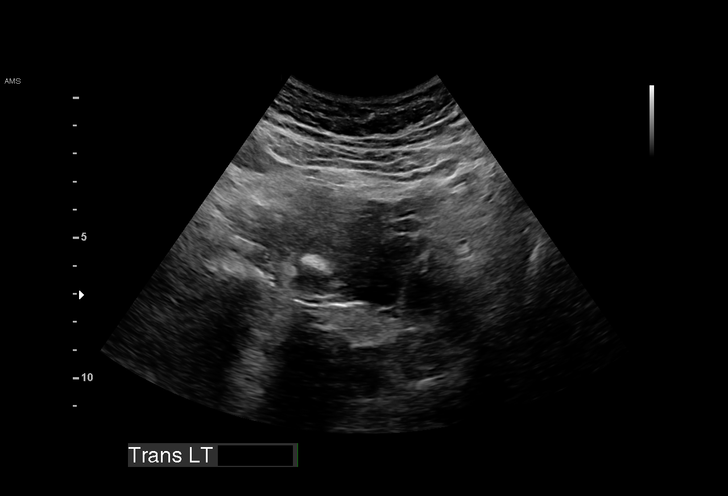
[im 9/13]
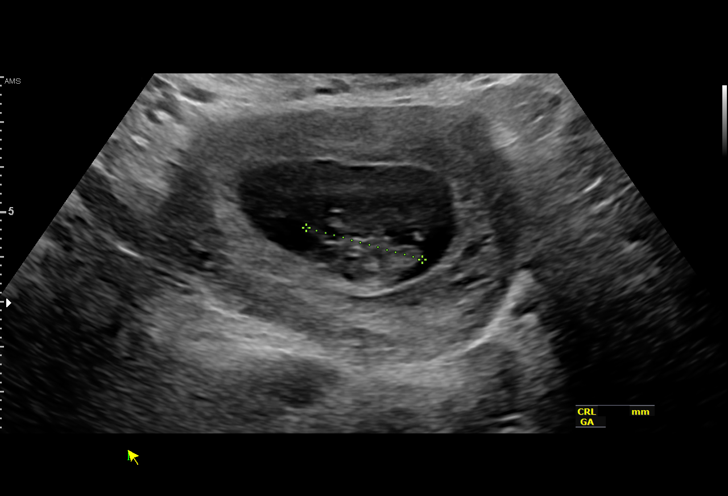
[im 10/13]
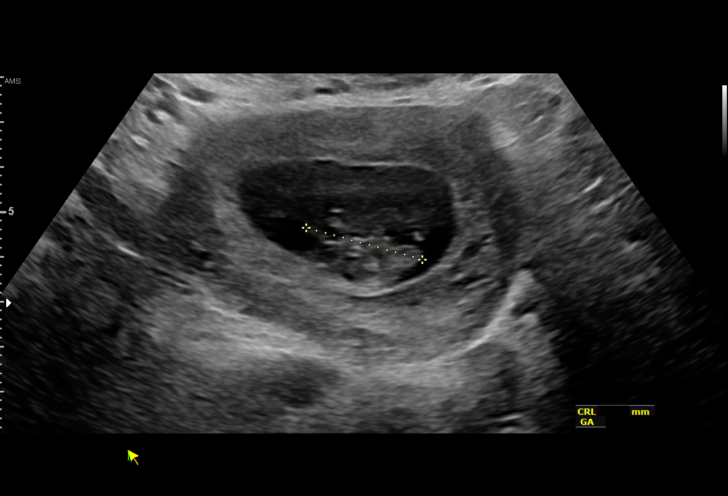
[im 11/13]
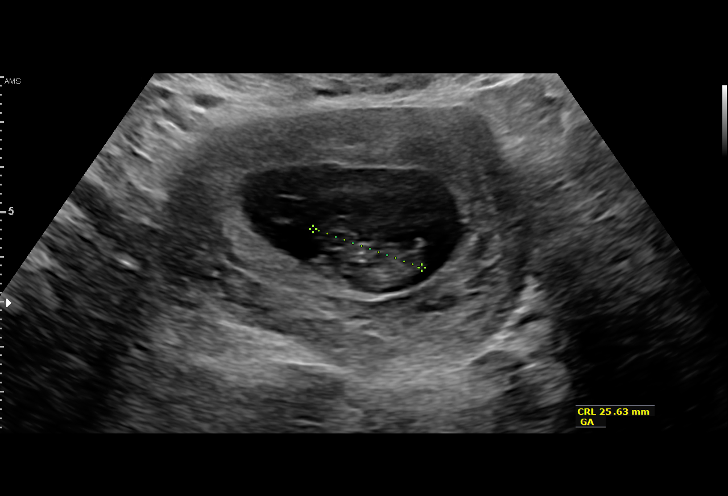
[im 12/13]
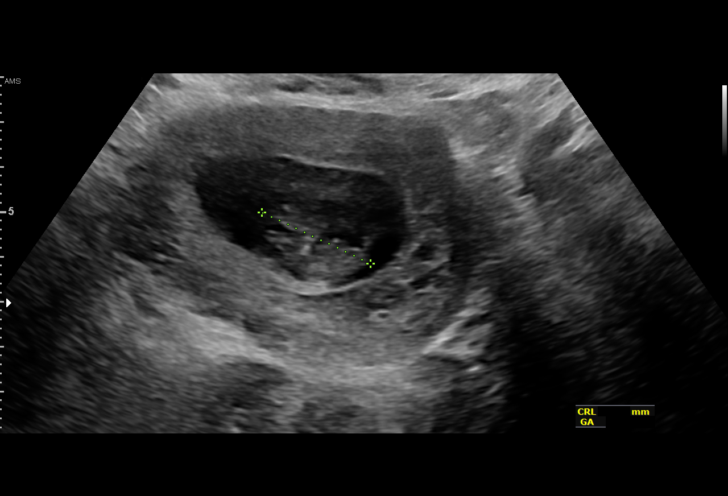
[im 13/13]
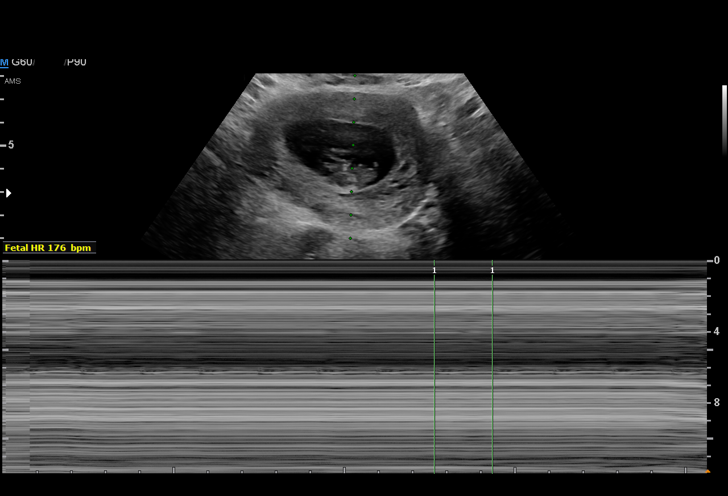

[13 of 13 positions shown; findings below may reference images not displayed]

FINDINGS: Intrauterine gestational sac: Present, single

Yolk sac:  Not visualized

Embryo:  Present

Cardiac Activity: Present

Heart Rate: 176 bpm

CRL:   26.4 mm   9 w 3 d                  US EDC: 08/28/2019

Subchorionic hemorrhage:  None visualized.

Maternal uterus/adnexae:

Neither ovary visualized, likely due to combination of enlarged
uterus and bowel loops in adnexa.

No free pelvic fluid or adnexal masses.
IMPRESSION: Single live intrauterine gestation at 9 weeks 3 days EGA by
crown-rump length.

No acute abnormalities.

Nonvisualization of ovaries.

## 2019-08-28 ENCOUNTER — Inpatient Hospital Stay (HOSPITAL_COMMUNITY): Admission: RE | Admit: 2019-08-28 | Payer: BC Managed Care – PPO | Source: Home / Self Care

## 2019-10-06 IMAGING — US US MFM OB DETAIL +14 WK
1 series · 13 of 28 positions shown · non-contrast
Comparison: none

[Series 1: us mfm ob detail +14 wk · 13 of 91 slices shown]
[im 4/91]
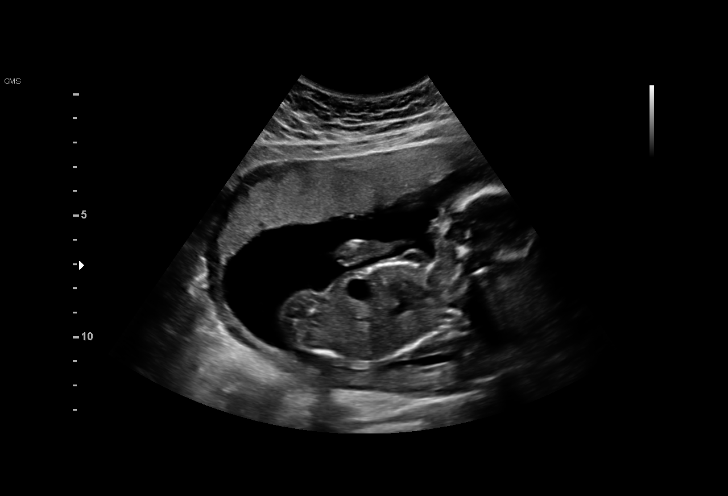
[im 11/91]
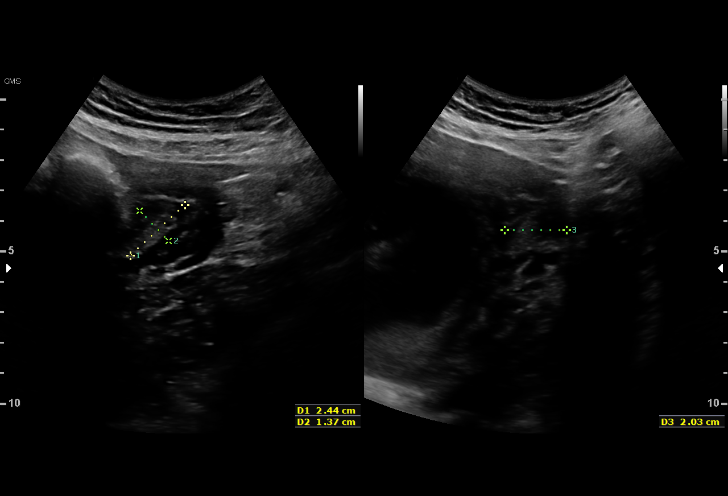
[im 17/91]
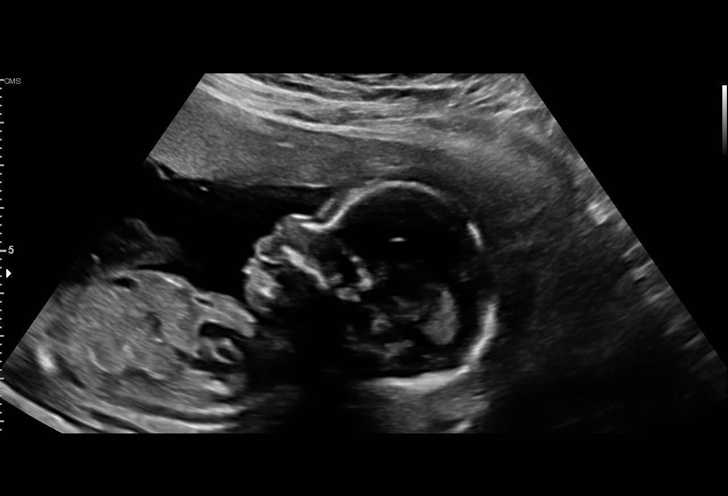
[im 24/91]
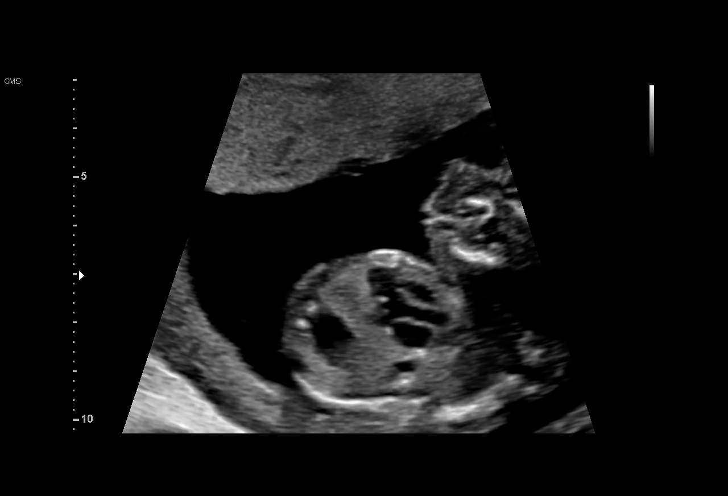
[im 31/91]
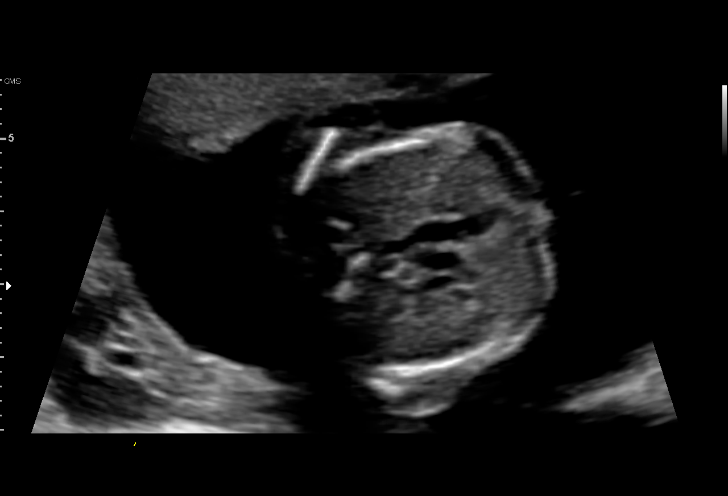
[im 37/91]
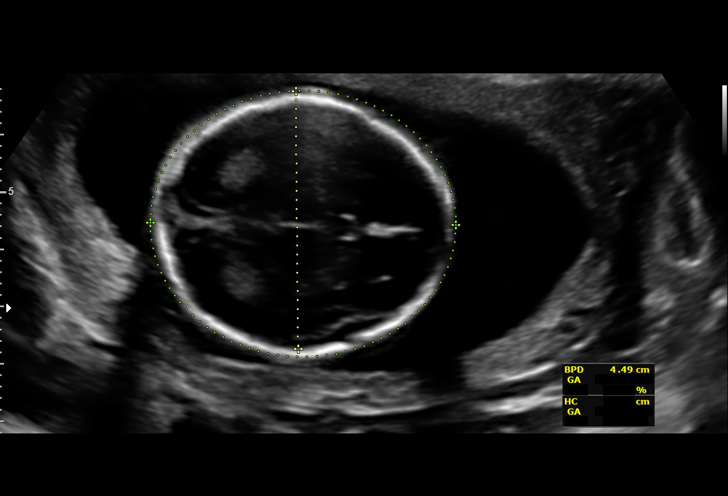
[im 47/91]
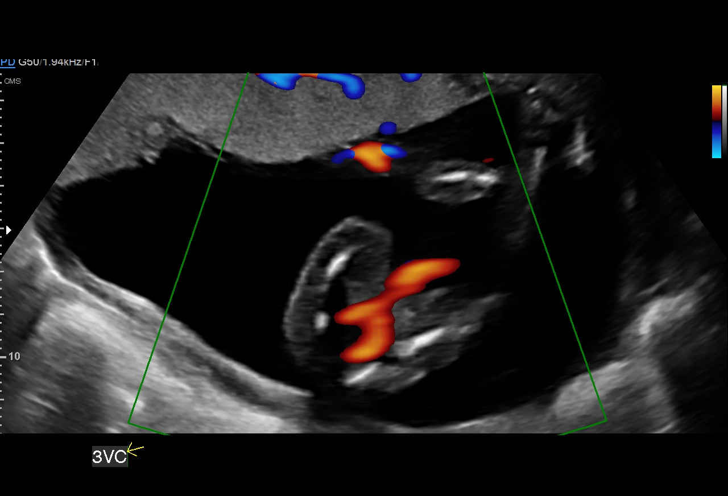
[im 54/91]
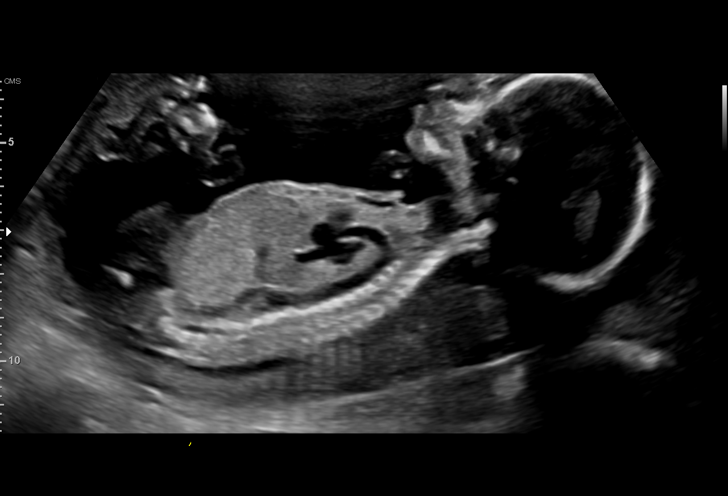
[im 61/91]
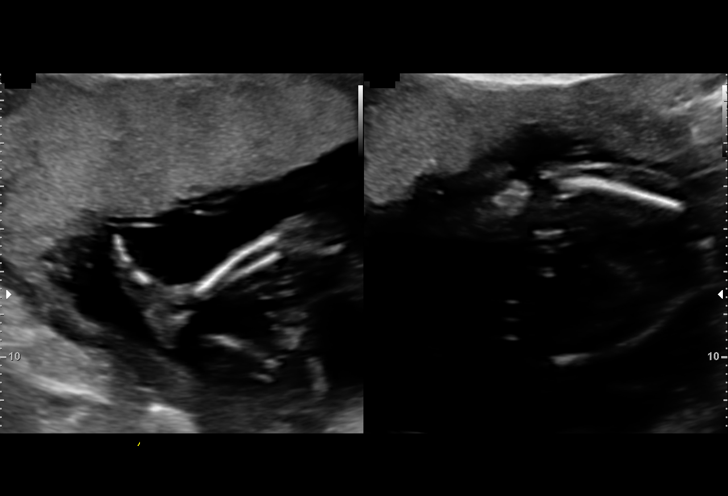
[im 67/91]
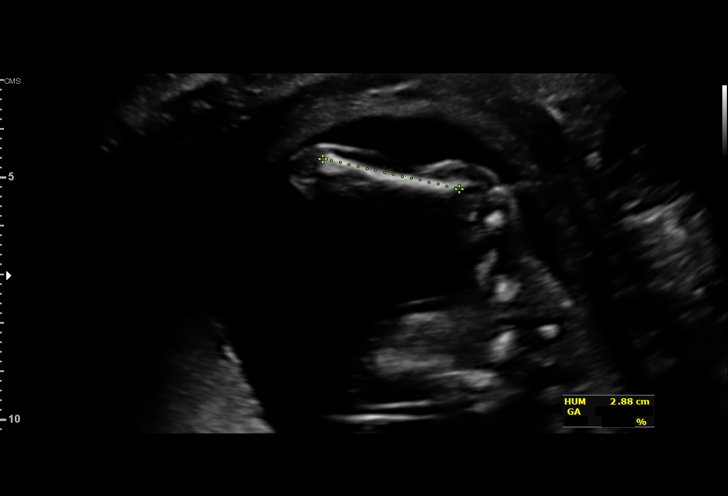
[im 74/91]
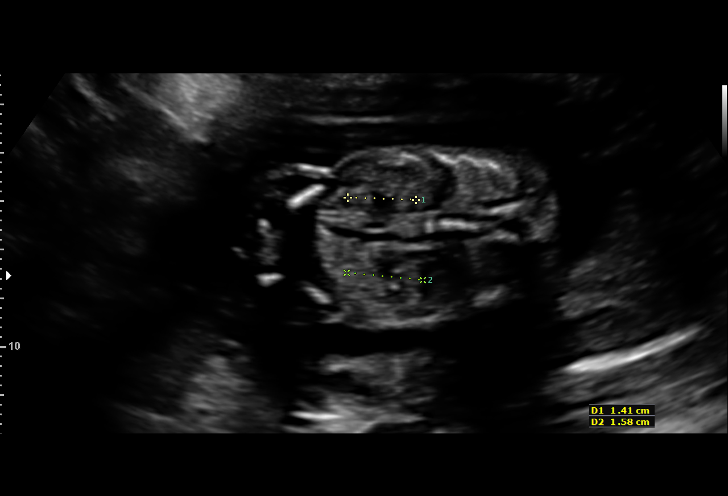
[im 81/91]
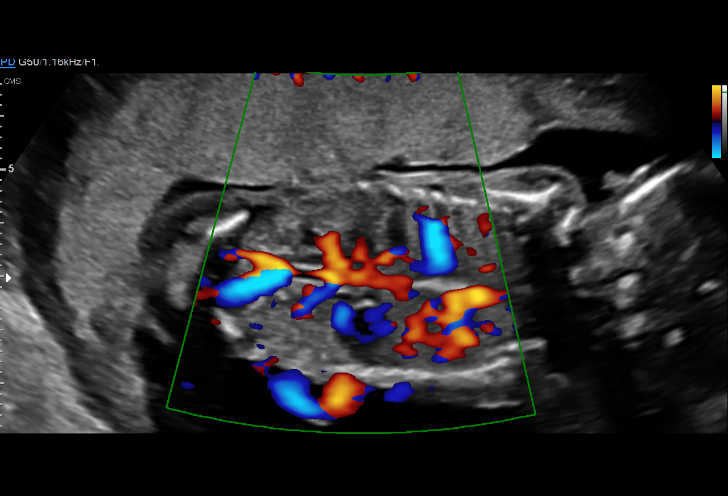
[im 87/91]
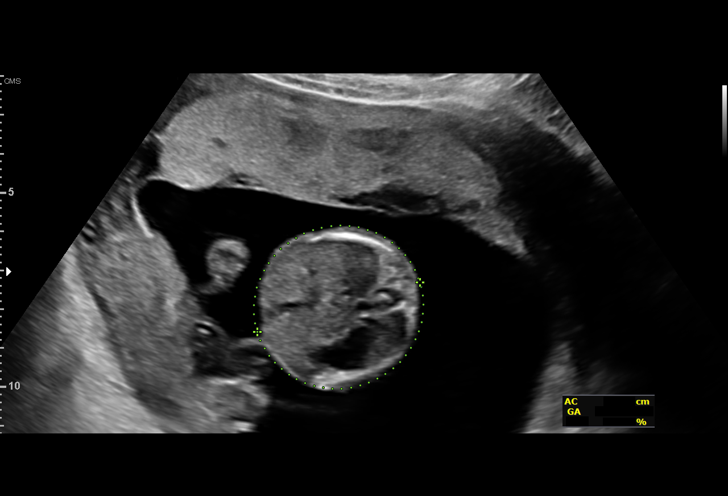

[13 of 28 positions shown; findings below may reference images not displayed]

----------------------------------------------------------------------

 ----------------------------------------------------------------------
Indications

  Antenatal screening for malformations
  Genetic carrier (silent Margot Pinho)
  Fetal abnormality - other known or
  suspected (LVEIF) - low risk NIPS
  19 weeks gestation of pregnancy
 ----------------------------------------------------------------------
Fetal Evaluation

 Num Of Fetuses:         1
 Fetal Heart Rate(bpm):  154
 Cardiac Activity:       Observed
 Presentation:           Cephalic
 Placenta:               Anterior
 P. Cord Insertion:      Visualized

 Amniotic Fluid
 AFI FV:      Within normal limits

                             Largest Pocket(cm)

Biometry

 BPD:      44.6  mm     G. Age:  19w 3d         71  %    CI:        74.35   %    70 - 86
                                                         FL/HC:      16.7   %    16.1 -
 HC:      164.2  mm     G. Age:  19w 1d         50  %    HC/AC:      1.23        1.09 -
 AC:      133.7  mm     G. Age:  18w 6d         41  %    FL/BPD:     61.7   %
 FL:       27.5  mm     G. Age:  18w 3d         23  %    FL/AC:      20.6   %    20 - 24
 HUM:      28.1  mm     G. Age:  19w 0d         51  %
 CER:      19.3  mm     G. Age:  18w 5d         40  %

 CM:        4.5  mm

 Est. FW:     254  gm      0 lb 9 oz     39  %
OB History
 Gravidity:    1         Term:   0        Prem:   0        SAB:   0
 TOP:          0       Ectopic:  0        Living: 0
Gestational Age

 LMP:           19w 0d        Date:  11/21/18                 EDD:   08/28/19
 U/S Today:     19w 0d                                        EDD:   08/28/19
 Best:          19w 0d     Det. By:  LMP  (11/21/18)          EDD:   08/28/19
Anatomy

 Cranium:               Appears normal         Aortic Arch:            Appears normal
 Cavum:                 Appears normal         Ductal Arch:            Appears normal
 Ventricles:            Appears normal         Diaphragm:              Appears normal
 Choroid Plexus:        Appears normal         Stomach:                Appears normal, left
                                                                       sided
 Cerebellum:            Appears normal         Abdomen:                Appears normal
 Posterior Fossa:       Appears normal         Abdominal Wall:         Appears nml (cord
                                                                       insert, abd wall)
 Nuchal Fold:           Appears normal         Cord Vessels:           Appears normal (3
                                                                       vessel cord)
 Face:                  Appears normal         Kidneys:                Appear normal
                        (orbits and profile)
 Lips:                  Appears normal         Bladder:                Appears normal
 Thoracic:              Appears normal         Spine:                  Appears normal
 Heart:                 Echogenic focus        Upper Extremities:      Appears normal
                        in LV
 RVOT:                  Appears normal         Lower Extremities:      Appears normal
 LVOT:                  Appears normal

 Other:  Heels and 5th digit visualized.
Cervix Uterus Adnexa

 Cervix
 Length:            3.8  cm.
 Normal appearance by transabdominal scan.

 Uterus
 No abnormality visualized.

 Left Ovary
 Within normal limits.

 Right Ovary
 Within normal limits.

 Cul De Sac
 No free fluid seen.

 Adnexa
 No abnormality visualized.
Impression

 We performed fetal anatomy scan. An echogenic intracardiac
 focus is seen. No other makers of aneuploidies or fetal
 structural defects are seen. Fetal biometry is consistent with
 her previously-established dates. Amniotic fluid is normal and
 good fetal activity is seen.
 I informed the patient that given that she had Adilson Eduardo Alcina for fetal
 aneuploidies on cell-free fetal DNA screening, finding of
 echogenic intracardiac focus should be considered a normal
 variant and that the risk of trisomy 21 is not increased. I also
 reassured that echogenic focus does not increase the risk of
 cardiac defects. I also informed her that only amniocentesis
 will give a defintive result on the fetal karyotype.
 Patient opted not to have amniocentesis.
Recommendations

 An appointment was made for her to return in 4 weeks for
                 Pirir, Zawadi

## 2019-10-22 DIAGNOSIS — Z113 Encounter for screening for infections with a predominantly sexual mode of transmission: Secondary | ICD-10-CM | POA: Diagnosis not present

## 2019-10-22 DIAGNOSIS — R300 Dysuria: Secondary | ICD-10-CM | POA: Diagnosis not present

## 2019-10-22 DIAGNOSIS — O9089 Other complications of the puerperium, not elsewhere classified: Secondary | ICD-10-CM | POA: Diagnosis not present

## 2020-01-31 DIAGNOSIS — N898 Other specified noninflammatory disorders of vagina: Secondary | ICD-10-CM | POA: Diagnosis not present

## 2022-11-28 ENCOUNTER — Ambulatory Visit (HOSPITAL_COMMUNITY)
Admission: EM | Admit: 2022-11-28 | Discharge: 2022-11-28 | Disposition: A | Discharge status: Home / Self Care | Payer: PRIVATE HEALTH INSURANCE

## 2022-11-28 DIAGNOSIS — F4323 Adjustment disorder with mixed anxiety and depressed mood: Secondary | ICD-10-CM | POA: Diagnosis not present

## 2022-11-28 DIAGNOSIS — Z566 Other physical and mental strain related to work: Secondary | ICD-10-CM | POA: Diagnosis not present

## 2022-11-28 NOTE — Discharge Instructions (Addendum)
Please follow up with ([facility]) Partial Hospitalization program. A referral has been made on your behalf.

## 2022-11-28 NOTE — ED Provider Notes (Signed)
[facility] Medical Screening Exam  Patient Name: Brittany Key MRN: [id number] Date of Evaluation: 11/28/22 Chief Complaint:  " If I do not get some help with my job I might explode" Diagnosis:  Final diagnoses:  Work-related stress  Adjustment disorder with mixed anxiety and depressed mood    History of Present illness: Brittany Key is a 32 y.o. female patient presented to [facility] as a walk in along stating, "if I do not get some help with my job I might explode".  [name], 32 y.o., female patient seen face to face by this provider and chart reviewed on 11/28/22.  Patient denies any previous psychiatric history.  She does not currently take any medications.  She has no outpatient psychiatric services in place.  She denies any substance use.  On evaluation Brittany Key reports she has been working full-time for [employer] and part-time for [employer].  She started her full-time job with [employer] 06/2022.  Since that time she has sent roughly 16 complaint letters to the head of HR with her concerns that she is being mistreated and gaslighted in the workplace.  However she for likes her complaints have gone unheard.  Her job has gotten so stressful that she feels that she needs a leave of absence to be able to regroup.  The stress from her job has also caused her to have feelings of depression, anxiety, and a decrease in sleep.  Reports it is difficult to get the negative events that happen throughout the day at her job off of her mind when she comes home.  She finds that it is becoming more difficult for her to stay focused and be productive.  She is afraid that eventually she is going to "explode" on someone.  When asked to explain this she states, "I just  mean verbally, I would never hurt anybody".   During evaluation:  Brittany Key is well-groomed and makes good eye contact.  She is well-spoken and very articulate.  She is alert/oriented x 4, cooperative, and attentive.   She has normal speech and behavior.  She does appear anxious.  She has a dysphoric affect.  She is denying SI/HI/AVH.  She does not appear to be responding to internal/external stimuli.  She is able to converse coherently with goal-directed thoughts and no distractibility.  She is afraid that if she yells or snaps at her boss that she may be fired.  Discussed with patient that she will need to discuss FMLA with her PCP.  Also discussed the partial hospitalization program with patient.  Explained this is a 3-week program with intensive group therapy.  Patient is agreeable.  Referral was made.  At this time [name] is educated and verbalizes understanding of mental health resources and other crisis services in the community.  She is instructed to call 911 and present to the nearest emergency room should she experience any suicidal/homicidal ideation, auditory/visual/hallucinations, or detrimental worsening of her mental health condition.  She was a also advised by Probation officer that she could call the toll-free phone on back of  insurance card to assist with identifying counselors and agencies in network.     [place] [facility] from 11/28/2022 in [place] No Risk       Psychiatric Specialty Exam  Presentation  General Appearance:Appropriate for Environment; Well Groomed  Eye Contact:Good  Speech:Clear and Coherent; Normal Rate  Speech Volume:Normal  Handedness:Right   Mood and Affect  Mood:Anxious; Depressed  Affect:Appropriate   Thought Process  Thought Processes:Coherent  Descriptions of Associations:Intact  Orientation:Full (Time, Place and Person)  Thought Content:Logical    Hallucinations:None  Ideas of Reference:None  Suicidal Thoughts:No  Homicidal Thoughts:No   Sensorium  Memory:Immediate Good; Recent Good; Remote Good  Judgment:Good  Insight:Good   Executive Functions  Concentration:Good  Attention  Span:Good  [place]  Language:Good   Psychomotor Activity  Psychomotor Activity:Normal   Assets  Assets:Communication Skills; Desire for Improvement; Financial Resources/Insurance; Physical Health; Resilience; Housing; Transportation; Vocational/Educational   Sleep  Sleep:Fair  Number of hours: 6   Physical Exam: Physical Exam Vitals and nursing note reviewed.  Constitutional:      General: She is not in acute distress.    Appearance: Normal appearance. She is not ill-appearing.  HENT:     Head: Normocephalic.  Eyes:     General:        Right eye: No discharge.        Left eye: No discharge.     Conjunctiva/sclera: Conjunctivae normal.  Cardiovascular:     Rate and Rhythm: Normal rate.  Pulmonary:     Effort: Pulmonary effort is normal. No respiratory distress.  Musculoskeletal:        General: Normal range of motion.     Cervical back: Normal range of motion.  Skin:    Coloration: Skin is not jaundiced or pale.  Neurological:     Mental Status: She is alert and oriented to person, place, and time.  Psychiatric:        Attention and Perception: Attention and perception normal.        Mood and Affect: Affect normal. Mood is anxious and depressed.        Speech: Speech normal.        Behavior: Behavior normal. Behavior is cooperative.        Thought Content: Thought content normal.        Cognition and Memory: Cognition normal.        Judgment: Judgment normal.    Review of Systems  Constitutional: Negative.   HENT: Negative.    Eyes: Negative.   Respiratory: Negative.    Cardiovascular: Negative.   Genitourinary: Negative.  Negative for dysuria.  Musculoskeletal: Negative.   Skin: Negative.   Neurological: Negative.   Psychiatric/Behavioral:  Positive for depression. The patient has insomnia.    Blood pressure 125/86, pulse 73, temperature 98.1 F (36.7 C), temperature source Oral, resp. rate 18, SpO2 100 %, unknown if  currently breastfeeding. There is no height or weight on file to calculate BMI.  Musculoskeletal: Strength & Muscle Tone: within normal limits Gait & Station: normal Patient leans: N/A   [place] MSE Discharge Disposition for Follow up and Recommendations: Based on my evaluation the patient does not appear to have an emergency medical condition and can be discharged with resources and follow up care in outpatient services for Medication Management, Partial Hospitalization Program, and Individual Therapy  Discharge patient  Referral made to [facility] program/[facility]  Provided outpatient psychiatric resources for medication management and therapy   Revonda Humphrey, NP 11/28/2022, 10:13 PM

## 2022-11-28 NOTE — Progress Notes (Signed)
   11/28/22 1605  [place] (Walk-ins at [facility] only)  How Did You Hear About [place]? Self  What Is the Reason for Your Visit/Call Today? Pt is a 32 yo female who presented voluntarily and unaccompanied due to worsening feeling that she "might explode on somebody." Pt stated that she works fulltime for [employer] and PT for [employer]. She stated her stress is coming from her fulltime job. Pt stated that things are not operating well and right. Pt stated that she just started her FT job in August and has sent 16 complaint letters to the head of HR to date. Pt stated that she is so filled with stress she feels she "might explode." Pt stated she wants to be written out of work for at least 3 months or supported in applying for a leave of absence. Pt denied SI (current or past), suicide attempts, HI, NSSH, AVHparanoia and subsrance use. Pt has no OP psychiatric providers currently and no PCP. Pt stated she last saw her PCP about 4 years ago.  How Long Has This Been Causing You Problems? 1-6 months  Have You Recently Had Any Thoughts About Hurting Yourself? No  Are You Planning to Commit Suicide/Harm Yourself At This time? No  Have you Recently Had Thoughts About [place]? No  Are You Planning To Harm Someone At This Time? No  Are you currently experiencing any auditory, visual or other hallucinations? No  Have You Used Any Alcohol or Drugs in the Past 24 Hours? No  Do you have any current medical co-morbidities that require immediate attention? No  Clinician description of patient physical appearance/behavior: Pt was calm, cooperative, alert and appeared oriented. Pt did not appear to be responding to internal stimuli, experiencing delusional thinking or to be intoxicated.  Pt's speech and movement appeared within normal limits and appearance was unremarkable. Pt's mood seemed solemn but not depressed, and pt had a flat affect which was congruent. Pt's judgment and insight seemed within normal  limits.  What Do You Feel Would Help You the Most Today? Treatment for Depression or other mood problem  If access to [facility] was not available, would you have sought care in the Emergency Department? No  Determination of Need Routine (7 days)  Options For Referral Medication Management;Outpatient Therapy   Cyler Kappes T. Mare Ferrari, [place], [facility], [facility] Triage Specialist [facility]

## 2022-11-29 ENCOUNTER — Telehealth (HOSPITAL_COMMUNITY): Payer: Self-pay | Admitting: Psychiatry

## 2022-11-29 NOTE — Telephone Encounter (Signed)
D:  Pt was referred to [facility] per Thomes Lolling, NP.  A:  Placed call to orient pt, but there was no answer.  Left vm requesting pt to call the case manager.  Inform Hoyle Sauer.
